# Patient Record
Sex: Female | Born: 1935 | Race: White | Hispanic: No | Smoking: Never smoker
Health system: Southern US, Community
[De-identification: ages and names within clinical notes are randomized; demographics above are authoritative.]

## PROBLEM LIST (undated history)

## (undated) DIAGNOSIS — M8580 Other specified disorders of bone density and structure, unspecified site: Secondary | ICD-10-CM

## (undated) DIAGNOSIS — IMO0002 Reserved for concepts with insufficient information to code with codable children: Secondary | ICD-10-CM

## (undated) DIAGNOSIS — C801 Malignant (primary) neoplasm, unspecified: Secondary | ICD-10-CM

## (undated) DIAGNOSIS — R339 Retention of urine, unspecified: Secondary | ICD-10-CM

## (undated) DIAGNOSIS — N819 Female genital prolapse, unspecified: Secondary | ICD-10-CM

## (undated) DIAGNOSIS — J189 Pneumonia, unspecified organism: Secondary | ICD-10-CM

## (undated) DIAGNOSIS — F0390 Unspecified dementia without behavioral disturbance: Secondary | ICD-10-CM

## (undated) HISTORY — PX: TOTAL KNEE ARTHROPLASTY: SHX125

## (undated) HISTORY — DX: Retention of urine, unspecified: R33.9

## (undated) HISTORY — DX: Reserved for concepts with insufficient information to code with codable children: IMO0002

## (undated) HISTORY — PX: HEMORRHOID SURGERY: SHX153

## (undated) HISTORY — PX: CARDIAC CATHETERIZATION: SHX172

## (undated) HISTORY — DX: Other specified disorders of bone density and structure, unspecified site: M85.80

## (undated) HISTORY — DX: Female genital prolapse, unspecified: N81.9

## (undated) HISTORY — DX: Pneumonia, unspecified organism: J18.9

## (undated) HISTORY — PX: BREAST SURGERY: SHX581

## (undated) HISTORY — PX: BACK SURGERY: SHX140

## (undated) HISTORY — DX: Malignant (primary) neoplasm, unspecified: C80.1

## (undated) HISTORY — PX: CHOLECYSTECTOMY: SHX55

## (undated) HISTORY — PX: VAGINAL HYSTERECTOMY: SUR661

---

## 1998-01-11 ENCOUNTER — Other Ambulatory Visit: Admission: RE | Admit: 1998-01-11 | Discharge: 1998-01-11 | Payer: Self-pay | Admitting: Cardiology

## 1998-01-15 ENCOUNTER — Ambulatory Visit (HOSPITAL_COMMUNITY): Admission: RE | Admit: 1998-01-15 | Discharge: 1998-01-15 | Payer: Self-pay | Admitting: Cardiology

## 1998-06-11 ENCOUNTER — Other Ambulatory Visit: Admission: RE | Admit: 1998-06-11 | Discharge: 1998-06-11 | Payer: Self-pay | Admitting: Obstetrics and Gynecology

## 1999-03-19 ENCOUNTER — Ambulatory Visit (HOSPITAL_COMMUNITY): Admission: RE | Admit: 1999-03-19 | Discharge: 1999-03-19 | Payer: Self-pay | Admitting: Gastroenterology

## 1999-06-24 ENCOUNTER — Other Ambulatory Visit: Admission: RE | Admit: 1999-06-24 | Discharge: 1999-06-24 | Payer: Self-pay | Admitting: Obstetrics and Gynecology

## 1999-10-15 ENCOUNTER — Encounter: Admission: RE | Admit: 1999-10-15 | Discharge: 1999-10-15 | Payer: Self-pay | Admitting: *Deleted

## 1999-10-16 ENCOUNTER — Ambulatory Visit (HOSPITAL_BASED_OUTPATIENT_CLINIC_OR_DEPARTMENT_OTHER): Admission: RE | Admit: 1999-10-16 | Discharge: 1999-10-16 | Payer: Self-pay | Admitting: *Deleted

## 1999-10-16 ENCOUNTER — Encounter (INDEPENDENT_AMBULATORY_CARE_PROVIDER_SITE_OTHER): Payer: Self-pay | Admitting: Specialist

## 2000-06-23 ENCOUNTER — Other Ambulatory Visit: Admission: RE | Admit: 2000-06-23 | Discharge: 2000-06-23 | Payer: Self-pay | Admitting: Obstetrics and Gynecology

## 2001-05-10 ENCOUNTER — Encounter (INDEPENDENT_AMBULATORY_CARE_PROVIDER_SITE_OTHER): Payer: Self-pay

## 2001-05-10 ENCOUNTER — Inpatient Hospital Stay (HOSPITAL_COMMUNITY): Admission: RE | Admit: 2001-05-10 | Discharge: 2001-05-11 | Payer: Self-pay | Admitting: *Deleted

## 2001-05-14 ENCOUNTER — Emergency Department (HOSPITAL_COMMUNITY): Admission: EM | Admit: 2001-05-14 | Discharge: 2001-05-14 | Payer: Self-pay | Admitting: Internal Medicine

## 2001-06-27 ENCOUNTER — Other Ambulatory Visit: Admission: RE | Admit: 2001-06-27 | Discharge: 2001-06-27 | Payer: Self-pay | Admitting: Obstetrics and Gynecology

## 2002-06-30 ENCOUNTER — Other Ambulatory Visit: Admission: RE | Admit: 2002-06-30 | Discharge: 2002-06-30 | Payer: Self-pay | Admitting: Obstetrics and Gynecology

## 2004-06-16 ENCOUNTER — Ambulatory Visit (HOSPITAL_COMMUNITY): Admission: RE | Admit: 2004-06-16 | Discharge: 2004-06-16 | Payer: Self-pay | Admitting: Gastroenterology

## 2004-07-22 ENCOUNTER — Other Ambulatory Visit: Admission: RE | Admit: 2004-07-22 | Discharge: 2004-07-22 | Payer: Self-pay | Admitting: Obstetrics and Gynecology

## 2004-09-21 DIAGNOSIS — J189 Pneumonia, unspecified organism: Secondary | ICD-10-CM

## 2004-09-21 HISTORY — DX: Pneumonia, unspecified organism: J18.9

## 2005-09-25 ENCOUNTER — Inpatient Hospital Stay (HOSPITAL_COMMUNITY): Admission: RE | Admit: 2005-09-25 | Discharge: 2005-09-30 | Payer: Self-pay | Admitting: Orthopedic Surgery

## 2005-09-25 ENCOUNTER — Ambulatory Visit: Payer: Self-pay | Admitting: Physical Medicine & Rehabilitation

## 2006-07-28 ENCOUNTER — Other Ambulatory Visit: Admission: RE | Admit: 2006-07-28 | Discharge: 2006-07-28 | Payer: Self-pay | Admitting: Obstetrics and Gynecology

## 2007-10-24 ENCOUNTER — Encounter: Admission: RE | Admit: 2007-10-24 | Discharge: 2007-10-24 | Payer: Self-pay | Admitting: General Surgery

## 2007-10-27 ENCOUNTER — Ambulatory Visit (HOSPITAL_BASED_OUTPATIENT_CLINIC_OR_DEPARTMENT_OTHER): Admission: RE | Admit: 2007-10-27 | Discharge: 2007-10-28 | Payer: Self-pay | Admitting: General Surgery

## 2008-02-22 ENCOUNTER — Encounter: Admission: RE | Admit: 2008-02-22 | Discharge: 2008-02-22 | Payer: Self-pay | Admitting: Orthopedic Surgery

## 2008-09-10 ENCOUNTER — Ambulatory Visit: Payer: Self-pay | Admitting: Obstetrics and Gynecology

## 2009-05-28 ENCOUNTER — Inpatient Hospital Stay (HOSPITAL_COMMUNITY): Admission: AD | Admit: 2009-05-28 | Discharge: 2009-06-04 | Payer: Self-pay | Admitting: Gastroenterology

## 2009-06-03 ENCOUNTER — Encounter (INDEPENDENT_AMBULATORY_CARE_PROVIDER_SITE_OTHER): Payer: Self-pay | Admitting: Gastroenterology

## 2009-09-11 ENCOUNTER — Other Ambulatory Visit: Admission: RE | Admit: 2009-09-11 | Discharge: 2009-09-11 | Payer: Self-pay | Admitting: Obstetrics and Gynecology

## 2009-09-11 ENCOUNTER — Ambulatory Visit: Payer: Self-pay | Admitting: Obstetrics and Gynecology

## 2009-10-08 ENCOUNTER — Ambulatory Visit: Payer: Self-pay | Admitting: Obstetrics and Gynecology

## 2009-12-12 ENCOUNTER — Ambulatory Visit: Payer: Self-pay | Admitting: Obstetrics and Gynecology

## 2009-12-26 ENCOUNTER — Ambulatory Visit: Payer: Self-pay | Admitting: Obstetrics and Gynecology

## 2009-12-30 ENCOUNTER — Ambulatory Visit: Payer: Self-pay | Admitting: Obstetrics and Gynecology

## 2010-01-07 ENCOUNTER — Ambulatory Visit: Payer: Self-pay | Admitting: Obstetrics and Gynecology

## 2010-01-23 ENCOUNTER — Ambulatory Visit: Payer: Self-pay | Admitting: Obstetrics and Gynecology

## 2010-05-27 ENCOUNTER — Ambulatory Visit: Payer: Self-pay | Admitting: Obstetrics and Gynecology

## 2010-08-22 ENCOUNTER — Inpatient Hospital Stay (HOSPITAL_COMMUNITY)
Admission: RE | Admit: 2010-08-22 | Discharge: 2010-08-26 | Payer: Self-pay | Source: Home / Self Care | Admitting: Orthopedic Surgery

## 2010-08-25 ENCOUNTER — Encounter (INDEPENDENT_AMBULATORY_CARE_PROVIDER_SITE_OTHER): Payer: Self-pay | Admitting: Orthopedic Surgery

## 2010-10-28 ENCOUNTER — Other Ambulatory Visit: Payer: Self-pay | Admitting: Orthopedic Surgery

## 2010-10-28 ENCOUNTER — Ambulatory Visit
Admission: RE | Admit: 2010-10-28 | Discharge: 2010-10-28 | Disposition: A | Discharge status: Home / Self Care | Payer: Medicare Other | Source: Ambulatory Visit | Attending: Orthopedic Surgery | Admitting: Orthopedic Surgery

## 2010-10-28 DIAGNOSIS — R52 Pain, unspecified: Secondary | ICD-10-CM

## 2010-10-28 DIAGNOSIS — R609 Edema, unspecified: Secondary | ICD-10-CM

## 2010-12-01 ENCOUNTER — Encounter (INDEPENDENT_AMBULATORY_CARE_PROVIDER_SITE_OTHER): Payer: Medicare Other | Admitting: Obstetrics and Gynecology

## 2010-12-01 DIAGNOSIS — N8111 Cystocele, midline: Secondary | ICD-10-CM

## 2010-12-01 DIAGNOSIS — R82998 Other abnormal findings in urine: Secondary | ICD-10-CM

## 2010-12-01 DIAGNOSIS — N760 Acute vaginitis: Secondary | ICD-10-CM

## 2010-12-02 LAB — CBC
HCT: 26.4 % — ABNORMAL LOW (ref 36.0–46.0)
HCT: 28.1 % — ABNORMAL LOW (ref 36.0–46.0)
HCT: 28.6 % — ABNORMAL LOW (ref 36.0–46.0)
Hemoglobin: 9.3 g/dL — ABNORMAL LOW (ref 12.0–15.0)
Hemoglobin: 9.5 g/dL — ABNORMAL LOW (ref 12.0–15.0)
Hemoglobin: 9.8 g/dL — ABNORMAL LOW (ref 12.0–15.0)
MCH: 30.3 pg (ref 26.0–34.0)
MCH: 30.6 pg (ref 26.0–34.0)
MCH: 30.9 pg (ref 26.0–34.0)
MCH: 31.4 pg (ref 26.0–34.0)
MCHC: 35.2 g/dL (ref 30.0–36.0)
MCV: 89.4 fL (ref 78.0–100.0)
MCV: 90.1 fL (ref 78.0–100.0)
Platelets: 252 10*3/uL (ref 150–400)
RBC: 3.12 MIL/uL — ABNORMAL LOW (ref 3.87–5.11)
RBC: 3.14 MIL/uL — ABNORMAL LOW (ref 3.87–5.11)
RDW: 13.2 % (ref 11.5–15.5)
RDW: 13.6 % (ref 11.5–15.5)
WBC: 10.6 10*3/uL — ABNORMAL HIGH (ref 4.0–10.5)
WBC: 9.9 10*3/uL (ref 4.0–10.5)

## 2010-12-02 LAB — BASIC METABOLIC PANEL
BUN: 7 mg/dL (ref 6–23)
BUN: 9 mg/dL (ref 6–23)
CO2: 23 mEq/L (ref 19–32)
CO2: 23 mEq/L (ref 19–32)
CO2: 23 mEq/L (ref 19–32)
CO2: 23 mEq/L (ref 19–32)
Calcium: 7.8 mg/dL — ABNORMAL LOW (ref 8.4–10.5)
Calcium: 8.1 mg/dL — ABNORMAL LOW (ref 8.4–10.5)
Chloride: 100 mEq/L (ref 96–112)
Chloride: 96 mEq/L (ref 96–112)
Creatinine, Ser: 0.51 mg/dL (ref 0.4–1.2)
GFR calc Af Amer: >60 mL/min (ref >60)
GFR calc Af Amer: >60 mL/min (ref >60)
GFR calc non Af Amer: >60 mL/min (ref >60)
Glucose, Bld: 125 mg/dL — ABNORMAL HIGH (ref 70–99)
Potassium: 3.3 mEq/L — ABNORMAL LOW (ref 3.5–5.1)
Potassium: 3.7 mEq/L (ref 3.5–5.1)
Potassium: 3.8 mEq/L (ref 3.5–5.1)
Sodium: 124 mEq/L — ABNORMAL LOW (ref 135–145)
Sodium: 130 mEq/L — ABNORMAL LOW (ref 135–145)
Sodium: 132 mEq/L — ABNORMAL LOW (ref 135–145)

## 2010-12-02 LAB — URINALYSIS, MICROSCOPIC ONLY
Glucose, UA: NEGATIVE mg/dL
Hgb urine dipstick: NEGATIVE
Ketones, ur: NEGATIVE mg/dL
Protein, ur: NEGATIVE mg/dL
Urobilinogen, UA: 1 mg/dL (ref 0.0–1.0)

## 2010-12-02 LAB — URINE CULTURE
Colony Count: NO GROWTH
Colony Count: NO GROWTH
Culture  Setup Time: 201112061209
Culture: NO GROWTH
Special Requests: NEGATIVE

## 2010-12-03 LAB — CBC
MCHC: 34.8 g/dL (ref 30.0–36.0)
Platelets: 370 10*3/uL (ref 150–400)
RDW: 13.4 % (ref 11.5–15.5)
WBC: 10.8 10*3/uL — ABNORMAL HIGH (ref 4.0–10.5)

## 2010-12-03 LAB — SURGICAL PCR SCREEN
MRSA, PCR: POSITIVE — AB
Staphylococcus aureus: POSITIVE — AB

## 2010-12-03 LAB — APTT: aPTT: 27 seconds (ref 24–37)

## 2010-12-03 LAB — COMPREHENSIVE METABOLIC PANEL
ALT: 19 U/L (ref 0–35)
Albumin: 3.5 g/dL (ref 3.5–5.2)
Calcium: 10.2 mg/dL (ref 8.4–10.5)
GFR calc Af Amer: >60 mL/min (ref >60)
Glucose, Bld: 99 mg/dL (ref 70–99)
Potassium: 4.2 mEq/L (ref 3.5–5.1)
Sodium: 137 mEq/L (ref 135–145)
Total Protein: 7.3 g/dL (ref 6.0–8.3)

## 2010-12-03 LAB — PROTIME-INR: INR: 0.97 (ref 0.00–1.49)

## 2010-12-03 LAB — URINE CULTURE
Culture  Setup Time: 201111290945
Culture: NO GROWTH

## 2010-12-03 LAB — URINE MICROSCOPIC-ADD ON

## 2010-12-03 LAB — URINALYSIS, ROUTINE W REFLEX MICROSCOPIC
Bilirubin Urine: NEGATIVE
Nitrite: NEGATIVE
Specific Gravity, Urine: 1.015 (ref 1.005–1.030)
Urobilinogen, UA: 0.2 mg/dL (ref 0.0–1.0)
pH: 6 (ref 5.0–8.0)

## 2010-12-03 LAB — DIFFERENTIAL
Eosinophils Absolute: 0.3 10*3/uL (ref 0.0–0.7)
Lymphs Abs: 2.5 10*3/uL (ref 0.7–4.0)
Monocytes Absolute: 0.9 10*3/uL (ref 0.1–1.0)
Monocytes Relative: 8 % (ref 3–12)
Neutrophils Relative %: 65 % (ref 43–77)

## 2010-12-03 LAB — TYPE AND SCREEN: ABO/RH(D): O NEG

## 2010-12-26 LAB — BASIC METABOLIC PANEL
BUN: 14 mg/dL (ref 6–23)
BUN: 5 mg/dL — ABNORMAL LOW (ref 6–23)
BUN: 6 mg/dL (ref 6–23)
BUN: 6 mg/dL (ref 6–23)
BUN: 9 mg/dL (ref 6–23)
CO2: 22 mEq/L (ref 19–32)
CO2: 25 mEq/L (ref 19–32)
CO2: 28 mEq/L (ref 19–32)
Calcium: 8.3 mg/dL — ABNORMAL LOW (ref 8.4–10.5)
Calcium: 8.4 mg/dL (ref 8.4–10.5)
Calcium: 8.7 mg/dL (ref 8.4–10.5)
Chloride: 102 mEq/L (ref 96–112)
Chloride: 104 mEq/L (ref 96–112)
Chloride: 94 mEq/L — ABNORMAL LOW (ref 96–112)
Chloride: 94 mEq/L — ABNORMAL LOW (ref 96–112)
Creatinine, Ser: 0.6 mg/dL (ref 0.4–1.2)
Creatinine, Ser: 0.62 mg/dL (ref 0.4–1.2)
Creatinine, Ser: 0.64 mg/dL (ref 0.4–1.2)
Creatinine, Ser: 0.67 mg/dL (ref 0.4–1.2)
Creatinine, Ser: 0.68 mg/dL (ref 0.4–1.2)
Creatinine, Ser: 0.73 mg/dL (ref 0.4–1.2)
GFR calc Af Amer: >60 mL/min (ref >60)
GFR calc Af Amer: >60 mL/min (ref >60)
GFR calc Af Amer: >60 mL/min (ref >60)
GFR calc non Af Amer: >60 mL/min (ref >60)
GFR calc non Af Amer: >60 mL/min (ref >60)
GFR calc non Af Amer: >60 mL/min (ref >60)
GFR calc non Af Amer: >60 mL/min (ref >60)
Glucose, Bld: 107 mg/dL — ABNORMAL HIGH (ref 70–99)
Glucose, Bld: 110 mg/dL — ABNORMAL HIGH (ref 70–99)
Glucose, Bld: 96 mg/dL (ref 70–99)
Potassium: 2.8 mEq/L — ABNORMAL LOW (ref 3.5–5.1)
Potassium: 2.9 mEq/L — ABNORMAL LOW (ref 3.5–5.1)
Potassium: 3.7 mEq/L (ref 3.5–5.1)
Potassium: 3.9 mEq/L (ref 3.5–5.1)
Sodium: 132 mEq/L — ABNORMAL LOW (ref 135–145)

## 2010-12-26 LAB — COMPREHENSIVE METABOLIC PANEL
ALT: 16 U/L (ref 0–35)
Alkaline Phosphatase: 113 U/L (ref 39–117)
BUN: 7 mg/dL (ref 6–23)
CO2: 25 mEq/L (ref 19–32)
Chloride: 100 mEq/L (ref 96–112)
GFR calc non Af Amer: >60 mL/min (ref >60)
Glucose, Bld: 109 mg/dL — ABNORMAL HIGH (ref 70–99)
Potassium: 3.7 mEq/L (ref 3.5–5.1)
Total Bilirubin: 0.7 mg/dL (ref 0.3–1.2)
Total Protein: 5.9 g/dL — ABNORMAL LOW (ref 6.0–8.3)

## 2010-12-26 LAB — CBC
HCT: 29.1 % — ABNORMAL LOW (ref 36.0–46.0)
HCT: 29.8 % — ABNORMAL LOW (ref 36.0–46.0)
HCT: 30.1 % — ABNORMAL LOW (ref 36.0–46.0)
HCT: 30.5 % — ABNORMAL LOW (ref 36.0–46.0)
HCT: 30.9 % — ABNORMAL LOW (ref 36.0–46.0)
HCT: 32.3 % — ABNORMAL LOW (ref 36.0–46.0)
Hemoglobin: 10.1 g/dL — ABNORMAL LOW (ref 12.0–15.0)
Hemoglobin: 10.5 g/dL — ABNORMAL LOW (ref 12.0–15.0)
Hemoglobin: 10.6 g/dL — ABNORMAL LOW (ref 12.0–15.0)
Hemoglobin: 11.1 g/dL — ABNORMAL LOW (ref 12.0–15.0)
MCHC: 34.3 g/dL (ref 30.0–36.0)
MCHC: 34.3 g/dL (ref 30.0–36.0)
MCHC: 34.5 g/dL (ref 30.0–36.0)
MCHC: 34.6 g/dL (ref 30.0–36.0)
MCV: 93.5 fL (ref 78.0–100.0)
MCV: 94.1 fL (ref 78.0–100.0)
MCV: 94.4 fL (ref 78.0–100.0)
MCV: 94.6 fL (ref 78.0–100.0)
Platelets: 242 10*3/uL (ref 150–400)
Platelets: 301 10*3/uL (ref 150–400)
Platelets: 377 10*3/uL (ref 150–400)
Platelets: 385 10*3/uL (ref 150–400)
Platelets: 582 10*3/uL — ABNORMAL HIGH (ref 150–400)
RBC: 3.02 MIL/uL — ABNORMAL LOW (ref 3.87–5.11)
RBC: 3.07 MIL/uL — ABNORMAL LOW (ref 3.87–5.11)
RBC: 3.26 MIL/uL — ABNORMAL LOW (ref 3.87–5.11)
RBC: 3.27 MIL/uL — ABNORMAL LOW (ref 3.87–5.11)
RBC: 3.41 MIL/uL — ABNORMAL LOW (ref 3.87–5.11)
RDW: 13.6 % (ref 11.5–15.5)
RDW: 13.6 % (ref 11.5–15.5)
RDW: 13.7 % (ref 11.5–15.5)
RDW: 13.9 % (ref 11.5–15.5)
RDW: 14.4 % (ref 11.5–15.5)
WBC: 11.8 10*3/uL — ABNORMAL HIGH (ref 4.0–10.5)
WBC: 15.2 10*3/uL — ABNORMAL HIGH (ref 4.0–10.5)
WBC: 16.4 10*3/uL — ABNORMAL HIGH (ref 4.0–10.5)
WBC: 18.3 10*3/uL — ABNORMAL HIGH (ref 4.0–10.5)

## 2010-12-26 LAB — URINALYSIS, ROUTINE W REFLEX MICROSCOPIC
Bilirubin Urine: NEGATIVE
Bilirubin Urine: NEGATIVE
Glucose, UA: NEGATIVE mg/dL
Glucose, UA: NEGATIVE mg/dL
Hgb urine dipstick: NEGATIVE
Ketones, ur: 15 mg/dL — AB
Ketones, ur: NEGATIVE mg/dL
Nitrite: NEGATIVE
Nitrite: POSITIVE — AB
Protein, ur: NEGATIVE mg/dL
Protein, ur: NEGATIVE mg/dL
Specific Gravity, Urine: 1.017 (ref 1.005–1.030)
Urobilinogen, UA: 1 mg/dL (ref 0.0–1.0)
pH: 6 (ref 5.0–8.0)

## 2010-12-26 LAB — TSH: TSH: 2.87 u[IU]/mL (ref 0.350–4.500)

## 2010-12-26 LAB — HEPATIC FUNCTION PANEL
ALT: 26 U/L (ref 0–35)
AST: 17 U/L (ref 0–37)
AST: 20 U/L (ref 0–37)
Albumin: 2 g/dL — ABNORMAL LOW (ref 3.5–5.2)
Albumin: 2.3 g/dL — ABNORMAL LOW (ref 3.5–5.2)
Alkaline Phosphatase: 120 U/L — ABNORMAL HIGH (ref 39–117)
Bilirubin, Direct: 0.7 mg/dL — ABNORMAL HIGH (ref 0.0–0.3)
Indirect Bilirubin: 1 mg/dL — ABNORMAL HIGH (ref 0.3–0.9)
Total Bilirubin: 1.7 mg/dL — ABNORMAL HIGH (ref 0.3–1.2)
Total Protein: 5.3 g/dL — ABNORMAL LOW (ref 6.0–8.3)
Total Protein: 6 g/dL (ref 6.0–8.3)

## 2010-12-26 LAB — CULTURE, BLOOD (ROUTINE X 2): Culture: NO GROWTH

## 2010-12-26 LAB — URINE CULTURE: Culture: NO GROWTH

## 2010-12-26 LAB — URINE MICROSCOPIC-ADD ON

## 2010-12-26 LAB — DIFFERENTIAL
Basophils Absolute: 0 10*3/uL (ref 0.0–0.1)
Basophils Absolute: 0 10*3/uL (ref 0.0–0.1)
Basophils Absolute: 0 10*3/uL (ref 0.0–0.1)
Basophils Relative: 0 % (ref 0–1)
Basophils Relative: 0 % (ref 0–1)
Basophils Relative: 0 % (ref 0–1)
Eosinophils Absolute: 0 10*3/uL (ref 0.0–0.7)
Eosinophils Absolute: 0.1 10*3/uL (ref 0.0–0.7)
Neutro Abs: 13.3 10*3/uL — ABNORMAL HIGH (ref 1.7–7.7)
Neutro Abs: 14 10*3/uL — ABNORMAL HIGH (ref 1.7–7.7)
Neutro Abs: 8.5 10*3/uL — ABNORMAL HIGH (ref 1.7–7.7)
Neutrophils Relative %: 78 % — ABNORMAL HIGH (ref 43–77)
Neutrophils Relative %: 84 % — ABNORMAL HIGH (ref 43–77)
Neutrophils Relative %: 86 % — ABNORMAL HIGH (ref 43–77)

## 2010-12-26 LAB — MAGNESIUM: Magnesium: 1.8 mg/dL (ref 1.5–2.5)

## 2010-12-26 LAB — VITAMIN B12: Vitamin B-12: 1250 pg/mL — ABNORMAL HIGH (ref 211–911)

## 2010-12-26 LAB — CANCER ANTIGEN 19-9: CA 19-9: 181.5 U/mL — ABNORMAL HIGH (ref <35.0)

## 2010-12-26 LAB — BRAIN NATRIURETIC PEPTIDE: Pro B Natriuretic peptide (BNP): 296 pg/mL — ABNORMAL HIGH (ref 0.0–100.0)

## 2010-12-26 LAB — FOLATE: Folate: >20 ng/mL

## 2011-02-03 NOTE — Op Note (Signed)
NAMEMERRIE, Anna Morris               ACCOUNT NO.:  [id number]   MEDICAL RECORD NO.:  [id number]          PATIENT TYPE:  AMB   LOCATION:  DSC                          FACILITY:  [facility]   PHYSICIAN:  Cherylynn Ridges, M.D.    DATE OF BIRTH:  09/02/1917   DATE OF PROCEDURE:  10/27/2007  DATE OF DISCHARGE:                               OPERATIVE REPORT   PREOPERATIVE DIAGNOSIS:  Right inguinal hernia.   POSTOPERATIVE DIAGNOSIS:  Right direct inguinal hernia.   PROCEDURE:  Modified Cooper/McVay right inguinal hernia repair with  mesh.   SURGEON:  Cherylynn Ridges, M.D.   ANESTHESIA:  General with a laryngeal airway.   ESTIMATED BLOOD LOSS:  Less than 30 mL.   No complications.   CONDITION:  Stable.   FINDINGS:  The patient had a very small direct defect with protrusion of  preperitoneal tissue.   INDICATIONS FOR OPERATION:  The patient is an 77 year old with a  symptomatic right inguinal hernia, who comes in now for repair.   OPERATION:  The patient was taken to the operating room and placed on  the table in supine position.  After an adequate general laryngeal  airway anesthetic was administered, she was prepped and draped in the  usual sterile manner exposing the right inguinal area.   A transverse curvilinear incision about 6 cm long was made using a #10  blade and taken down into the subcutaneous tissue, where a subcutaneous  vein was ligated with 3-0 Vicryl ties.  We dissected down to the  external oblique fascia, which we then opened along with its fibers  through the superficial ring.  The ilioinguinal nerve was identified  along with the round ligament.  We were able to mobilize that laterally  as we identified the hernia defect medially.  This direct defect was  about 2 cm in size and was imbricated on itself using interrupted 0  Ethibond sutures.  We also suture-ligated a piece of fat at the base of  the round ligament.  The nerve was spared.  We then placed an oval piece  of mesh measuring 5 x 3 cm in size, attaching it to the pubic tubercle  and the conjoined tendon anteromedially, and inferolaterally we started  off attaching it to Cooper's ligament and then as we got to the  transition zone attached it to the reflected portion of the inguinal  ligament, making sure not to place any pressure on the femoral vein or  the femoral artery.   The mesh was attached using a running 0 Prolene suture.  Once this was  done we irrigated it with antibiotic solution, in which it had been  soaked prior to implantation.  Once this was done we allowed the round  ligament and the ilioinguinal nerve fall back into the canal.  The  external oblique fascia was reapproximated using running 3-0 Vicryl  suture.  The subcu, Scarpa fascia was reapproximated using interrupted 3-  0 Vicryl.  Then the skin was closed using  a running subcuticular stitch of 4-0 Monocryl.  Marcaine 0.5% with  epinephrine was injected  into the wound up to 20 mL.  Dermabond, Steri-  Strips and Tegaderm were applied.  All needle, sponge counts and  instrument counts were correct.      Cherylynn Ridges, M.D.  Electronically Signed     JOW/MEDQ  D:  10/27/2007  T:  10/28/2007  Job:  [id number]

## 2011-02-06 NOTE — Op Note (Signed)
[facility]  Patient:    Anna Morris                       MRN: [id number] Proc. Date: 10/16/99 Adm. Date:  [id number] Attending:  Stephenie Acres                           Operative Report  PREOPERATIVE DIAGNOSIS:  Left shoulder and left forearm mass.  POSTOPERATIVE DIAGNOSIS:  Left shoulder and left forearm mass.  PROCEDURE:  Excision of both masses.  ANESTHESIA:  Local MAC.  SURGEON:  Catalina Lunger, M.D.  DESCRIPTION OF PROCEDURE:  The patient was taken to the operating room and placed in a supine position and after adequate anesthesia was induced, using MAC technique, the left shoulder and arm were prepped and draped in the normal sterile fashion using 1% lidocaine local anesthesia overlying the mass in the left lateral shoulder, the skin and subcutaneous tissue was anesthetized.  Incision was made, dissected down, a fatty tumor was delivered up and through the wound without difficulty, excised in its entirety.  Adequate hemostasis was ensured and the skin was closed with subcuticular 4-0 Monocryl.  Then, I turned my attention to the small area in the left antecubital fossa.  Again, this was anesthetized.  A small incision was made over it.  A small fatty tumor was delivered and excised.  Again the skin was closed with subcuticular 4-0 Monocryl.  Steri-Strips and sterile dressings were applied. The patient tolerated the procedure well and went to the PACU in good condition. DD:  10/16/99 TD:  10/16/99 Job: [id number]

## 2011-02-06 NOTE — Op Note (Signed)
NAME:  Anna Morris, Anna Morris               ACCOUNT NO.:  [id number]   MEDICAL RECORD NO.:  [id number]          PATIENT TYPE:  AMB   LOCATION:  ENDO                         FACILITY:  [facility]   PHYSICIAN:  Anselmo Rod, M.D.  DATE OF BIRTH:  07-15-1917   DATE OF PROCEDURE:  06/16/2004  DATE OF DISCHARGE:                                 OPERATIVE REPORT   PROCEDURE PERFORMED:  Screening colonoscopy.   ENDOSCOPIST:  Charna Elizabeth, M.D.   INSTRUMENT USED:  Olympus video colonoscope.   INDICATIONS FOR PROCEDURE:  The patient is an 77 year old white female with  a history of pandiverticulosis and a personal history of breast cancer with  recent rectal bleeding.  Rule out colonic polyps, masses, etc.   PREPROCEDURE PREPARATION:  Informed consent was procured from the patient.  The patient was fasted for eight hours prior to the procedure and prepped  with a bottle of magnesium citrate and a gallon of GoLYTELY the night prior  to the procedure.   PREPROCEDURE PHYSICAL:  The patient had stable vital signs.  Neck supple.  Chest clear to auscultation.  S1 and S2 regular.  Abdomen soft with normal  bowel sounds.   DESCRIPTION OF PROCEDURE:  The patient was placed in left lateral decubitus  position and sedated with 50 mg of Demerol and 5 mg of Versed in slow  incremental doses.  Once the patient was adequately sedated and maintained  on low flow oxygen and continuous cardiac monitoring, the Olympus video  colonoscope was advanced from the rectum to the cecum.  There was evidence  of pandiverticulosis.  Small internal hemorrhoids and external hemorrhoids  were seen on retroflexion and anal inspection.  No masses or polyps were  identified.  The appendicular orifice and ileocecal valve were clearly  visualized and photographed.  The patient tolerated the procedure well  without immediate complications.   IMPRESSION:  1.  Pandiverticulosis.  2.  Small internal and external hemorrhoids.  3.  No  masses or polyps seen.   RECOMMENDATIONS:  1.  Continue high fiber diet with liberal fluid intake.  2.  Repeat colonoscopy is recommended in the next five years unless the      patient develops any abnormal symptoms in the interim.  3.  Outpatient followup as need arises in the future.       JNM/MEDQ  D:  06/16/2004  T:  06/17/2004  Job:  [id number]   cc:   Windle Guard, M.D.  [address]  [place], [place] [address]  Fax: [phone]

## 2011-02-06 NOTE — Op Note (Signed)
NAME:  Anna Morris, Anna Morris NO.:  [id number]   MEDICAL RECORD NO.:  [id number]          PATIENT TYPE:  INP   LOCATION:  2899                         FACILITY:  [facility]   PHYSICIAN:  Dyke Brackett, M.D.    DATE OF BIRTH:  1916-12-09   DATE OF PROCEDURE:  DATE OF DISCHARGE:                                 OPERATIVE REPORT   INDICATIONS:  The patient is an 77 year old with intractable knee pain on  the right side thought to be amenable to inpatient hospitalization for total  knee replacement.   PREOPERATIVE DIAGNOSIS:  Severe osteoarthritis of right knee.   POSTOPERATIVE DIAGNOSIS:  Severe osteoarthritis of right knee.   OPERATION:  Right total knee replacement (DePuy LCS rotating platform medium  with size-2 tibia, medium patella, and 15-mm bearing).   SURGEON:  Dyke Brackett, M.D.   ASSISTANTAlisa Graff, P.A.   TOURNIQUET TIME:  One hour 13 minutes.   ANESTHESIA:  General.   DESCRIPTION OF PROCEDURE:  Sterile prep and drape with exsanguination of the  leg and inflation of tourniquet to 350.  Straight skin incision, medial  parapatellar approach to the knee.  Cutting the tibia about 2 to 3 mm below  the most diseased medial compartment with a moderate varus inclination to  the knee with more severe changes on the medial side.  Femur was sized and  cut with an anterior-posterior femoral cut, had a 15 mm flexion/extension  gap equal with the 4-degree valgus cut.  Moderate balancing required with  stripping of the medial side.  A 15 mm again flexion and extension gaps  equalized and balanced.   Chamfer cuts were placed with the finishing guide as well as the keel holes  for the prosthesis on the femoral side.  The tibia was cut with a keel for  the tibia as well with a size 2.  Trials were placed.  Slight recurvatum was  noted with good stability though in flexion at 15, and the patella was cut  leaving about 14 mm ___________ three-peg patella.  Patella trial was  used  with the final components and excellent stability, no tendency to varus  ___________.  Full range of motion.  Trials removed.  The bony surfaces were  irrigated.  The final prosthesis was inserted, tibia followed by femur and  patella.  Excess cement was removed, allowed to harden.  Copious irrigation  carried out prior  to and before insertion of the trial components.  Closure was effected on  the capsule with #1 Ethibond, 2-0 Vicryl and skin clips.  Marcaine with  epinephrine of the skin.  Lightly compressive sterile dressing and knee  immobilizer applied.  Taken to the recovery room in stable condition.      Dyke Brackett, M.D.  Electronically Signed     WDC/MEDQ  D:  09/25/2005  T:  09/25/2005  Job:  [id number]

## 2011-02-06 NOTE — H&P (Signed)
NAME:  Anna Morris, Anna Morris NO.:  [id number]   MEDICAL RECORD NO.:  [id number]          PATIENT TYPE:  INP   LOCATION:  NA                           FACILITY:  [facility]   PHYSICIAN:  Dyke Brackett, M.D.    DATE OF BIRTH:  23-Jun-1917   DATE OF ADMISSION:  09/25/2005  DATE OF DISCHARGE:                                HISTORY & PHYSICAL   CHIEF COMPLAINT:  Right knee pain for the last 3-4 years.   HISTORY OF PRESENT ILLNESS:  This 77 year old white female patient presented  to Dr. Madelon Lips with a three to four-year history of gradual onset, but  progressively worsening right knee pain.  She has had no known injury or  prior surgery to her knee but the pain is getting worse.  It is now a  constant sensation which the intensity varies.  It is a throbbing sensation  over the medial joint line with some radiation proximally into the mid thigh  at times.  Pain increases if she turns the wrong way or catches her right  foot on the floor and then decreases with sitting down or Tylenol Arthritis.  She is not taking any additional medications for pain.  She does complain of  the knee catching at times, locking, swelling, and keeping her up at night  at times.  No grinding or giving way.  She is not ambulating with any  assistive devices.  She had received cortisone shots in the past and that  helped for about a year.   ALLERGIES:  AUGMENTIN, LEVAQUIN, CIPRO, SULFA, DARVOCET caused chest  discomfort, CODEINE caused unknown reaction, FLAGYL, ERYTHROMYCIN.   CURRENT MEDICATIONS:  1.  Atenolol 50 mg one tablet p.o. b.i.d.  2.  Evista 60 mg one tablet p.o. q.a.m.  3.  Aspirin 81 mg one tablet p.o. q.a.m.  Last dose September 17, 2005.  4.  Patanol ophthalmic solution 0.1% one drop in both eyes b.i.d.  5.  Refresh tears one drop both eyes b.i.d.  6.  Metamucil one tablespoon p.o. q.h.s.  7.  Lasix 40 mg one tablet p.o. q.a.m. p.r.n. swelling.  8.  Multivitamin one tablet p.o. q.a.m.  9.  Vitamin C and calcium tablets one tablet of each p.o. q.a.m.   PAST MEDICAL HISTORY:  1.  Hypertension diagnosed two to three years ago.  2.  Diverticulosis.  3.  Osteoporosis.   She denies any history of diabetes mellitus, thyroid disease, hiatal hernia,  reflux, peptic ulcer disease, heart disease, asthma, or any other chronic  medical conditions other than noted previously.   PAST SURGICAL HISTORY:  1.  Appendectomy, age 56.  2.  Tonsillectomy.  3.  Hysterectomy, 1969.  4.  Right ear surgery due to mastoid problems, 1970 by Dr. Anner Crete.  5.  Mastectomy 1975 by Dr. Glynda Jaeger.  6.  Removal of ruptured lumbar disk, 1996 by Dr. Venetia Maxon.  7.  Hemorrhoidectomy and hernia repair, 1997.  8.  Laparoscopic cholecystectomy by Dr. Luan Pulling, 2002.   She denies any complications from the above-mentioned procedures.   SOCIAL HISTORY:  She denies any history of  cigarette smoking, alcohol use,  or drug use.  She is a widow and has three children.  She lives by herself  in a one-story house with one step into the main entrance.  She is a retired  Architectural technologist.  Her medical doctor is Dr. Windle Guard in [place], [place] at [phone].   FAMILY HISTORY:  Mother died at the age of 32 with TIAs, stroke, and  hypertension.  Father died at the age of 24 with an aneurysm.  She has one  living sister age 77 who is healthy and she had two brothers and two sisters  who have passed away, one brother in his 38s with complications from  alcoholism, one in his 72s, a sister in her 25s, and one sister in her 64s  who died with breast cancer.  She has three sons and they are age 32, 67,  and 21 and all are alive and well.   REVIEW OF SYSTEMS:  She does have recent bladder infections, last one in  November 2006 and Dr. Vonita Moss treats her for that.  She is off antibiotics  now.  She does have glaucoma.  She gets recurrent right ear infections as a  complication from her surgery way back  in the 1970s.  She has some shortness  of breath with exertion.  Occasional diarrhea due to the diverticulosis.  Has some arthritic complaints of her hands.  She does wear glasses and  hearing aid on the right.  She does have some urinary urgency and frequency  at times.  She does have a living will.  All other systems are negative and  noncontributory.   PHYSICAL EXAMINATION:  GENERAL:  Well-developed, well-nourished elderly  white female who walks with a mildly antalgic gait.  No assistive devices.  Accompanied by her son.  Mood and affect are appropriate.  Talks easily with  examiner.  Height 5 feet 1 inch.  Weight 141 pounds, BMI is 26.  VITAL SIGNS:  Temperature 97.8 degrees Fahrenheit, pulse 56, respirations  16, and blood pressure 152/80.  HEENT:  Normocephalic, atraumatic without frontal or maxillary sinus  tenderness to palpation.  Conjunctivae pink.  Sclerae anicteric.  PERLA.  EOMs intact.  No visible external ear deformities.  Hearing grossly intact.  Left tympanic membrane is pearly gray with good light reflex.  Right TM is  scarred.  Light reflex is present but there is some scarring noted in the  ear canal.  Nose and nasal septum midline.  Nasal mucosa pink and moist  without exudates or polyps noted.  Buccal mucosa pink and moist.  Dentition  in good repair.  Pharynx without erythema or exudates.  Tongue and uvula  midline.  Tongue without vesiculations and uvula rises equally with  phonation.  NECK:  No visible masses or lesions noted.  Trachea midline.  No palpable  lymphadenopathy nor thyromegaly.  Carotids +2 bilaterally without bruits.  She does have decrease in range of motion of her cervical spine but it seems  to be fairly even.  It seems to be kind of even throughout all the ranges.  Nontender to palpation with range of motion of the cervical spine.  CARDIOVASCULAR:  Heart rate and rhythm regular.  S1-S2 present without rubs,  clicks, or murmurs  noted. RESPIRATORY:  Respirations even and unlabored.  Breath sounds clear to  auscultation bilaterally without rales or wheezes noted.  ABDOMEN:  Rounded abdominal contour.  Bowel sounds present x4 quadrants.  Soft, nontender  to palpation without hepatosplenomegaly nor CVA tenderness.  Femoral pulses +2 bilaterally.  Nontender to palpation along the vertebral  column.  BREASTS:  Deferred at this time.  GENITOURINARY:  Deferred at this time.  RECTAL:  Deferred at this time.  PELVIC:  Deferred at this time.  MUSCULOSKELETAL:  No obvious deformities bilateral upper extremities with  full range of motion of these extremities without pain.  Radial pulses +2  bilaterally.  She has full range of motion of her hips, ankles, and toes  bilaterally.  DP and PT pulses are +2 but there is mild pitting +1-2 edema  both lower extremities.  No calf pain with palpation.  Negative Homans sign  bilaterally.   Left knee skin intact.  No erythema or ecchymosis.  She has full extension  and flexion to 130 degrees without crepitus.  She does have pain with  palpation over the lateral joint line.  No effusion.  Stable to varus and  valgus stress.  Negative anterior drawer.  Right knee skin intact.  No  erythema or ecchymosis.  Full extension and flexion only to 110 degrees with  minimal crepitus.  There is maybe a small effusion in the knee.  She does  have some pain with palpation over the medial joint line, none laterally.  Stable to varus and valgus stress.  Negative anterior drawer.  NEUROLOGIC:  Alert and oriented x3.  Cranial nerves II-XII are grossly  intact.  Strength 5/5 bilateral upper and lower extremities.  Rapid  alternating movements intact.  Deep tendon reflexes 2+ bilateral upper and  lower extremities.  Sensation intact to light touch.   RADIOLOGIC FINDINGS:  X-rays taken of her right knee in September of 2004  showed moderate changes medial compartment arthrosis.   IMPRESSION:  1.   Osteoarthritis medial compartment right knee.  2.  Hypertension.  3.  Diverticulosis.  4.  Osteoporosis.  5.  Glaucoma.  6.  History of frequent bladder infections treated by Dr. Vonita Moss.  7.  History of right ear mastoid problems treated with surgery.   PLAN:  Ms. Stillman will be admitted to [facility] on September 25, 2005, where she will undergo a right total knee arthroplasty by Dr. Marcie Mowers.  She will undergo all the routine preoperative laboratory  tests and studies prior to this procedure.  If we have any medical issues  while she is hospitalized, we will consult one of the hospitalists.      Legrand Pitts Duffy, Arnetha Courser, M.D.  Electronically Signed    KED/MEDQ  D:  09/18/2005  T:  09/18/2005  Job:  [id number]

## 2011-02-06 NOTE — Op Note (Signed)
[facility]  Patient:    Anna Morris, Anna Morris Visit Number: [id number] MRN: [id number]          Service Type: SUR Location: 3E 0320 01 Attending Physician:  Vikki Ports Proc. Date: 05/10/01 Adm. Date:  05/10/2001                             Operative Report  PREOPERATIVE DIAGNOSIS:  Symptomatic cholelithiasis.  POSTOPERATIVE DIAGNOSIS:  Symptomatic cholelithiasis.  PROCEDURE:  Laparoscopic cholecystectomy.  SURGEON:  Catalina Lunger, M.D.  ANESTHESIA:  General.  DESCRIPTION OF PROCEDURE:  The patient was taken to the operating room and placed in the supine position.  After adequate anesthesia was induced using endotracheal tube, the abdomen was prepped and draped in the normal sterile fashion.  Using a transverse infraumbilical incision, I dissected down to the fascia.  The fascia was opened vertically.  An 0 Vicryl pursestring suture was placed around the fascial defect, a Hasson trocar was placed in the abdomen. The abdomen was insufflated to 15 mmHg using continuous-flow carbon dioxide. Under direct visualization, a 10 mm port was placed in the subxiphoid region. Two 5 mm ports were placed in the right abdomen.  The gallbladder was identified and retracted cephalad.  The gallbladder appeared bilobulated.  A number of filmy adhesions were taken down from the lateral wall.  The fundus of the gallbladder was grasped.  The neck of the gallbladder was retracted laterally.  The cystic duct was easily identified, and the triangle of Calot was also identified.  The small cystic artery was triply clipped and divided. The cystic duct, which measured about 1-2 mm was triply clipped and divided. The gallbladder was taken off the gallbladder bed using Bovie electrocautery and removed through the umbilical port.  Adequate hemostasis was ensured, and all trocars were removed.  The fascial defect was closed with the 0 Vicryl pursestring  suture.  Skin incisions were closed with subcuticular 4-0 Monocryl and Steri-Strips and sterile dressings were applied.  All tissues were injected using 0.5% Marcaine.  The patient tolerated the procedure well and went to PACU in good condition. Attending Physician:  Vikki Ports DD:  05/10/01 TD:  05/10/01 Job: [id number]

## 2011-02-06 NOTE — Discharge Summary (Signed)
NAME:  Anna Morris, Anna Morris NO.:  [id number]   MEDICAL RECORD NO.:  [id number]          PATIENT TYPE:  INP   LOCATION:  5018                         FACILITY:  [facility]   PHYSICIAN:  Dyke Brackett, M.D.    DATE OF BIRTH:  03/02/1917   DATE OF ADMISSION:  09/25/2005  DATE OF DISCHARGE:                                 DISCHARGE SUMMARY   ADMITTING DIAGNOSES:  Osteoarthritis of the right knee.   DISCHARGE DIAGNOSES:  1.  Osteoarthritis of the right knee.  2.  History of hypertension.  3.  History of diverticulosis.  4.  History of osteoporosis.  5.  History of glaucoma.  6.  History of frequent bladder infections.  7.  History of right ear mastoiditis.  8.  Postoperative anemia.  9.  Mild hyponatremia.   PROCEDURES:  Right total knee arthroplasty.   HISTORY:  This patient is an 77 year old white female presented to Dr.  Madelon Lips with a 3-4 year history of gradual onset of progressively worsening  right knee pain.  She has had no known injury or prior surgery to her left  knee but the pain is worsening.  Has now constant sensation which the  intensity varies.  There is a throbbing sensation of the medial joint line  with some radiation approximately into the mid thigh at times.  Pain  increases as she turns the wrong the way or catches her right foot on the  floor and then decreases with sitting down or with Tylenol Arthritis.  She  has not taken any additional medications for pain.  She does complain of the  knee catching at times as well as locking and swelling.  This keeps her up  at night.  No grinding or giving way.  She is not ambulating with any  assistive devices at this time.  She has received cortisone injections in  the past which have helped for about one year.  Unfortunately she has failed  conservative treatment and is now indicated for total joint arthroplasty.   HOSPITAL COURSE:  An 77 year old white female admitted September 25, 2005 after  appropriate  laboratory studies were obtained as well as 1 gram of Ancef IV  on call in the operating room.  She was taken to the operating room where  she underwent a right total knee arthroplasty.  She tolerated the procedure  well.  She was placed on Dilaudid PCA pump postoperatively.  In actuality  apparently she had an allergy to Ancef and was placed on Cleocin 600 mg  prior to surgery IV.  Postoperatively she was placed on Vancomycin 1 gram IV  q.12 hours.   Consultations with PT, OT, care management and rehab [facility] was ordered. CPM  from 0-90 degrees for 6-8 hours per day.  She was allowed weight bearing as  tolerated.  She was started on Lovenox 30 mg subq q.12 hours starting on  September 26, 2005.  She is allowed out of bed to chair the following day.  She  was ambulating in physical therapy.  She did have postoperative anemia and  required 2  units of packed cells which were transfused on January 8th.  The  remainder of her hospital course was uneventful.  Dopplers were obtained in  the right lower leg.  She was then discharged to __________ for continued  care.  She was discharged in improved condition.   LABORATORY DATA:  She was admitted with a hemoglobin of 13.8, hematocrit  40.2%, white count 6,400, platelet count 274,000.  Discharge hemoglobin was  12.1, hematocrit 34.8%. White count 17,100, platelets 202,000.  Preoperative  sodium was 140, potassium was 3.7, chloride 106, CO2 of 28, glucose 108, BUN  10, creatinine 0.8, calcium 9.7, total protein 6.6, albumin 3.9.  AST 19,  ALT 14, ALP 63.  Total bilirubin 0.7.  Discharge sodium 132, potassium 4.0,  chloride 104, CO2 25, glucose 124, BUN 7, creatinine 0.6 and calcium 8.4.  Urinalysis was benign for voided urine.  Blood type was 0 negative, antibody  screen negative.  Chest x-ray of 09/17/05, revealed heart is mildly  enlarged.  There is mild tortuosity and ectasia of the thoracic aorta.  The  lungs demonstrate mild changes COPD and there  are what appear to be scarring  type changes in the lung apices.  Bony structures are intact.  EKG was read  as normal sinus rhythm with minimal voltage criteria for LVH.  This may be  normal variant.   DISCHARGE INSTRUCTIONS:  She may have a regular diet as tolerated.  PT for  range of motion and strengthening of the right lower extremity.  CPM from 0-  90 degrees at least 6-8 hours per day.  May increase by 5-10 degrees a day  until she reaches 110 degrees.  She may ambulate with a walker as previously  instructed.   MEDICATIONS:  1.  Percocet 5/325 1-2 p.o. q.4h p.r.n. pain.  She will continue with her preoperative medications:  1.  Atenolol 50 mg 1 tablet b.i.d.  2.  Evista 60 mg 1 tablet q. a.m.  3.  Multivitamins 1 daily.  4.  Vitamin C 1 tablet daily.  5.  Calcium 1 tablet daily.  6.  Lasix 40 mg p.o. q. a.m. p.r.n. swelling.  7.  Patanol ophthalmic solution 0.1% one drop both eyes b.i.d.  8.  Cipro drops to left ear.  9.  Metamucil 1 tablespoon h.s.  10. Lovenox 40 mg subq daily for 9 days.   She will follow back up with Dr. Madelon Lips two weeks from her surgery for re-  evaluation.  If she has increase in temperatures, drainage, redness or  increasing pain, she is to return earlier.      Oris Drone Santiago Bumpers, P.A.-C.      Dyke Brackett, M.D.  Electronically Signed    BDP/MEDQ  D:  09/29/2005  T:  09/29/2005  Job:  [id number]

## 2011-02-21 ENCOUNTER — Emergency Department (HOSPITAL_COMMUNITY): Payer: Medicare Other

## 2011-02-21 ENCOUNTER — Emergency Department (HOSPITAL_COMMUNITY)
Admission: EM | Admit: 2011-02-21 | Discharge: 2011-02-21 | Disposition: A | Discharge status: Home / Self Care | Payer: Medicare Other | Attending: Emergency Medicine | Admitting: Emergency Medicine

## 2011-02-21 DIAGNOSIS — N390 Urinary tract infection, site not specified: Secondary | ICD-10-CM | POA: Insufficient documentation

## 2011-02-21 DIAGNOSIS — Z853 Personal history of malignant neoplasm of breast: Secondary | ICD-10-CM | POA: Insufficient documentation

## 2011-02-21 DIAGNOSIS — I10 Essential (primary) hypertension: Secondary | ICD-10-CM | POA: Insufficient documentation

## 2011-02-21 DIAGNOSIS — M545 Low back pain, unspecified: Secondary | ICD-10-CM | POA: Insufficient documentation

## 2011-02-21 DIAGNOSIS — Z9889 Other specified postprocedural states: Secondary | ICD-10-CM | POA: Insufficient documentation

## 2011-02-21 DIAGNOSIS — Z90710 Acquired absence of both cervix and uterus: Secondary | ICD-10-CM | POA: Insufficient documentation

## 2011-02-21 LAB — URINALYSIS, ROUTINE W REFLEX MICROSCOPIC
Nitrite: NEGATIVE
Protein, ur: NEGATIVE mg/dL
Specific Gravity, Urine: 1.013 (ref 1.005–1.030)
Urobilinogen, UA: 0.2 mg/dL (ref 0.0–1.0)

## 2011-02-21 LAB — URINE MICROSCOPIC-ADD ON

## 2011-02-28 ENCOUNTER — Emergency Department (HOSPITAL_COMMUNITY): Payer: Medicare Other

## 2011-02-28 ENCOUNTER — Inpatient Hospital Stay (HOSPITAL_COMMUNITY)
Admission: EM | Admit: 2011-02-28 | Discharge: 2011-03-04 | DRG: 392 | Disposition: A | Discharge status: Skilled Nursing Facility | Payer: Medicare Other | Attending: Internal Medicine | Admitting: Internal Medicine

## 2011-02-28 DIAGNOSIS — M810 Age-related osteoporosis without current pathological fracture: Secondary | ICD-10-CM | POA: Diagnosis present

## 2011-02-28 DIAGNOSIS — T50995A Adverse effect of other drugs, medicaments and biological substances, initial encounter: Secondary | ICD-10-CM | POA: Diagnosis present

## 2011-02-28 DIAGNOSIS — Z66 Do not resuscitate: Secondary | ICD-10-CM | POA: Diagnosis present

## 2011-02-28 DIAGNOSIS — Z96659 Presence of unspecified artificial knee joint: Secondary | ICD-10-CM

## 2011-02-28 DIAGNOSIS — E871 Hypo-osmolality and hyponatremia: Secondary | ICD-10-CM | POA: Diagnosis present

## 2011-02-28 DIAGNOSIS — R627 Adult failure to thrive: Secondary | ICD-10-CM | POA: Diagnosis present

## 2011-02-28 DIAGNOSIS — Z853 Personal history of malignant neoplasm of breast: Secondary | ICD-10-CM

## 2011-02-28 DIAGNOSIS — R109 Unspecified abdominal pain: Secondary | ICD-10-CM | POA: Diagnosis present

## 2011-02-28 DIAGNOSIS — F29 Unspecified psychosis not due to a substance or known physiological condition: Secondary | ICD-10-CM | POA: Diagnosis present

## 2011-02-28 DIAGNOSIS — Z87440 Personal history of urinary (tract) infections: Secondary | ICD-10-CM

## 2011-02-28 DIAGNOSIS — R197 Diarrhea, unspecified: Secondary | ICD-10-CM | POA: Diagnosis present

## 2011-02-28 DIAGNOSIS — K5732 Diverticulitis of large intestine without perforation or abscess without bleeding: Principal | ICD-10-CM | POA: Diagnosis present

## 2011-02-28 DIAGNOSIS — Z8614 Personal history of Methicillin resistant Staphylococcus aureus infection: Secondary | ICD-10-CM

## 2011-02-28 LAB — DIFFERENTIAL
Basophils Absolute: 0.1 10*3/uL (ref 0.0–0.1)
Lymphs Abs: 1.3 10*3/uL (ref 0.7–4.0)
Monocytes Absolute: 1.1 10*3/uL — ABNORMAL HIGH (ref 0.1–1.0)
Monocytes Relative: 18 % — ABNORMAL HIGH (ref 3–12)
Neutro Abs: 3.2 10*3/uL (ref 1.7–7.7)

## 2011-02-28 LAB — CBC
MCHC: 34.1 g/dL (ref 30.0–36.0)
Platelets: 295 10*3/uL (ref 150–400)
RDW: 13.8 % (ref 11.5–15.5)
WBC: 6 10*3/uL (ref 4.0–10.5)

## 2011-02-28 LAB — URINE MICROSCOPIC-ADD ON

## 2011-02-28 LAB — URINALYSIS, ROUTINE W REFLEX MICROSCOPIC
Glucose, UA: NEGATIVE mg/dL
Ketones, ur: NEGATIVE mg/dL
Nitrite: NEGATIVE
Specific Gravity, Urine: 1.016 (ref 1.005–1.030)
pH: 6 (ref 5.0–8.0)

## 2011-02-28 LAB — COMPREHENSIVE METABOLIC PANEL
AST: 49 U/L — ABNORMAL HIGH (ref 0–37)
Albumin: 3.8 g/dL (ref 3.5–5.2)
Alkaline Phosphatase: 157 U/L — ABNORMAL HIGH (ref 39–117)
Chloride: 96 mEq/L (ref 96–112)
Creatinine, Ser: 0.8 mg/dL (ref 0.4–1.2)
Potassium: 4.1 mEq/L (ref 3.5–5.1)
Total Bilirubin: 0.4 mg/dL (ref 0.3–1.2)
Total Protein: 7.5 g/dL (ref 6.0–8.3)

## 2011-02-28 LAB — LIPASE, BLOOD: Lipase: 40 U/L (ref 11–59)

## 2011-02-28 MED ORDER — IOHEXOL 300 MG/ML  SOLN
100.0000 mL | Freq: Once | INTRAMUSCULAR | Status: AC | PRN
  Administered 2011-02-28: 100 mL via INTRAVENOUS

## 2011-03-01 ENCOUNTER — Inpatient Hospital Stay (HOSPITAL_COMMUNITY): Payer: Medicare Other

## 2011-03-01 LAB — URINE CULTURE: Colony Count: 6000

## 2011-03-01 LAB — DIFFERENTIAL
Basophils Absolute: 0 10*3/uL (ref 0.0–0.1)
Basophils Relative: 0 % (ref 0–1)
Eosinophils Absolute: 0.3 10*3/uL (ref 0.0–0.7)
Eosinophils Relative: 6 % — ABNORMAL HIGH (ref 0–5)
Monocytes Absolute: 0.7 10*3/uL (ref 0.1–1.0)
Monocytes Relative: 13 % — ABNORMAL HIGH (ref 3–12)
Neutro Abs: 2.9 10*3/uL (ref 1.7–7.7)

## 2011-03-01 LAB — COMPREHENSIVE METABOLIC PANEL
ALT: 28 U/L (ref 0–35)
Albumin: 2.8 g/dL — ABNORMAL LOW (ref 3.5–5.2)
Alkaline Phosphatase: 151 U/L — ABNORMAL HIGH (ref 39–117)
Calcium: 8.8 mg/dL (ref 8.4–10.5)
GFR calc Af Amer: >60 mL/min (ref >60)
Glucose, Bld: 90 mg/dL (ref 70–99)
Potassium: 3.9 mEq/L (ref 3.5–5.1)
Sodium: 135 mEq/L (ref 135–145)
Total Protein: 5.8 g/dL — ABNORMAL LOW (ref 6.0–8.3)

## 2011-03-01 LAB — CBC
Hemoglobin: 10.7 g/dL — ABNORMAL LOW (ref 12.0–15.0)
MCH: 29.7 pg (ref 26.0–34.0)
MCHC: 33.8 g/dL (ref 30.0–36.0)
Platelets: 243 10*3/uL (ref 150–400)
RDW: 13.8 % (ref 11.5–15.5)

## 2011-03-01 LAB — TSH: TSH: 2.563 u[IU]/mL (ref 0.350–4.500)

## 2011-03-01 NOTE — H&P (Signed)
Anna Morris, GRANDBERRY NO.:  [id number]  MEDICAL RECORD NO.:  [id number]  LOCATION:  WLED                         FACILITY:  [facility]  PHYSICIAN:  Michiel Cowboy, MDDATE OF BIRTH:  12-06-1916  DATE OF ADMISSION:  02/28/2011 DATE OF DISCHARGE:                             HISTORY & PHYSICAL   ATTENDING PHYSICIAN:  Michiel Cowboy, M.D.  PRIMARY CARE PROVIDER:  Pearla Dubonnet, M.D.  CHIEF COMPLAINT:  Low back pain and abdominal pain as well as generalized fatigue and poor appetite.  HISTORY OF PRESENT ILLNESS:  Patient is a 77 year old female with past history of hypertension, osteoporosis and remote history of breast cancer, status post right-sided mastectomy.  Patient lives at home by herself.  About a week ago or so, she has suffered a fall when she was trying to hit the flank in her yard with Garden tools.  She lost balance and fell.  Since then, she had been having a lot of back pain, knee pain, elbow pain with occasional tingling in her thumb and in one other finger which she also injured during this fall.  The low back pain on the left side seem to bother her the most in the past and on February 21, 2011, she actually went to Emergency Department and had this evaluated. She had left hip films as well as lumbar films, which did not show any acute fractures.  Her pain is usually well controlled with Tylenol extra strength.  Next, for the past few days, she developed lower abdominal symptoms, suprapubic pain and feeling poorly.  She presented to her primary care provider, who felt maybe she has exacerbation of her diverticulitis and prescribed the ciprofloxacin and Flagyl.  She took since Thursday.  At first, her symptoms improved, but then she continued to still have lower abdominal pain and started to have decreased p.o. intake, hardly drank anything today at all.  She became weak.  Over difficulty getting off a chair because of fatigue and was  brought into Emergency Department.  In ER, she was found to have likely seems to be urinary tract infection, at which point, triad hospitalist was called for an admission.  REVIEW OF SYSTEMS:  Otherwise review of systems, no chest pain, no shortness of breath, just generalized malaise and pain from the fall. Her family reports perhaps subjective fevers, but otherwise review of systems unremarkable, 10 systems reviewed.  PAST MEDICAL HISTORY:  Significant for: 1. Breast cancer, status post right mastectomy, in remission. 2. Herniated disk. 3. Hypertension. 4. Osteoporosis. 5. History of urinary retention.  SOCIAL HISTORY:  Patient does not smoke or drink or abuse drugs.  She lives alone by herself, which is her wishes.  FAMILY HISTORY:  Noncontributory.  ALLERGIES:  Her allergy list is not very clear.  She cannot remember exactly what she is allergic to, but thinks a lot of the antibiotics give her diarrhea.  Her family thinks most likely the only true allergies are SULFA, PENICILLIN, CIPRO and FLAGYL in her allergy least, but she has been taking them successfully without any trouble.  There is also a possibility of ERYTHROMYCIN reaction and a reaction to NITROFURANTOIN.  Again, the family  is not sure if it is true and what kind of reaction she has had.  She already received ceftriaxone in the Emergency Department without any complications.  She denies any history of severe reaction to any antibiotics.  MEDICATIONS: 1. She has recently been put on Cipro 500 twice a day and Flagyl, dose     is unclear. 2. Atenolol 50 mg b.i.d. 3. Ocuvite 1 tablet daily. 4. Boniva 150 mg every month. 5. Aspirin 81 mg daily. 6. Tamsulosin 0.4 mg daily. 7. Lasix in all days except Wednesdays and Sundays. 8. Potassium 10 mEq on all days except Wednesdays and Sundays. 9. Multivitamins. 10.Vitamin D3 2000 units daily. 11.Fish oil 1200 mg daily. 12.Calcium 1200 mg daily. 13.Zyrtec 10 mg  daily.  PHYSICAL EXAMINATION:  VITAL SIGNS:  Temperature 99.3, blood pressure 143/51, pulse 66, respirations 16, satting 98% on room air. GENERAL:  Patient appears to be currently in no acute disease. HEENT:  Head nontraumatic.  Slightly dry mucous membranes. SKIN:  Decreased skin turgor. LUNGS:  Clear to auscultation bilaterally. HEART:  Regular rhythm.  No murmurs appreciated. ABDOMEN:  Soft, nontender, nondistended. EXTREMITIES:  No clubbing, cyanosis or edema noted.  There are scars on bilateral knee, consistent bilateral knee replacements and some point tenderness over left sacroiliac joint and also some tenderness over the right elbow where she fell. NEUROLOGICALLY:  She appears to move all four extremities and appears to be neurologically intact.  LABORATORY DATA:  White blood cell count 6.0, hemoglobin 12.5.  Sodium 132, potassium 4.1, creatinine 0.8, alk phos 151, AST 49, calcium 10.2, lipase 40.  UA showing 21-50 white blood cells, but rare bacteria.  DIAGNOSTIC STUDIES:  CT scan showing no changes except progressive dilatation of common bile duct of unclear etiology.  No stone noted and patient is status post cholecystectomy.  She has had hip films as well as lumbar spine films on February 21, 2011 which are both unremarkable.  ASSESSMENT AND PLAN:  This is a 77 year old female, who presents with malaise and what seems to be urinary tract infection. 1. Urinary tract infection:  She tolerated Rocephin, we will continue     for now.  Await urine culture.  She was on Cipro before this and     this could have been partially treated since patient has had a     computed tomography scan of her abdomen, which does not show any     evidence of diverticulitis.  We will hold off on this.  Also, we     will hold off on Cipro and Flagyl. 2. Recent fall:  Continue her Tylenol as needed for pain since that     seems to help.  We will order physical therapy and occupational     therapy  evaluation that can also help with evaluation for safety at     home.  Patient is adamant that she does not wish to be placed. 3. Hyponatremia:  She had a history of this in the past and in the     past her hydrochlorothiazide was stopped in attempts to improve her     sodium and her sodium is still slightly low.  We will see if gentle     intravenous hydration would help.  She seems to be little bit on     dry side today.  We will also order urine, lytes.  Check TSH. 4. Prophylaxis:  We will write for Protonix and Lovenox. 5. Code status:  Patient is do not  resuscitate, do not intubate.  I have spent a total of 1 hour on this admission.     Michiel Cowboy, MD     AVD/MEDQ  D:  02/28/2011  T:  02/28/2011  Job:  [id number]  cc:   Pearla Dubonnet, M.D. Fax: [phone]  Electronically Signed by Therisa Doyne MD on 03/01/2011 02:46:45 AM

## 2011-03-02 LAB — CLOSTRIDIUM DIFFICILE BY PCR: Toxigenic C. Difficile by PCR: NEGATIVE

## 2011-03-03 ENCOUNTER — Inpatient Hospital Stay (HOSPITAL_COMMUNITY): Payer: Medicare Other

## 2011-03-03 LAB — BASIC METABOLIC PANEL
CO2: 19 mEq/L (ref 19–32)
Chloride: 106 mEq/L (ref 96–112)
Creatinine, Ser: 0.49 mg/dL (ref 0.4–1.2)
Glucose, Bld: 95 mg/dL (ref 70–99)
Sodium: 136 mEq/L (ref 135–145)

## 2011-03-03 LAB — CBC
HCT: 33.6 % — ABNORMAL LOW (ref 36.0–46.0)
MCH: 30.1 pg (ref 26.0–34.0)
MCV: 87.3 fL (ref 78.0–100.0)
RBC: 3.85 MIL/uL — ABNORMAL LOW (ref 3.87–5.11)
WBC: 6.6 10*3/uL (ref 4.0–10.5)

## 2011-03-15 NOTE — Discharge Summary (Signed)
Anna Morris, [name] NO.:  [id number]  MEDICAL RECORD NO.:  [id number]  LOCATION:  1504                         FACILITY:  [facility]  PHYSICIAN:  Pearla Dubonnet, M.D.DATE OF BIRTH:  December 13, 1916  DATE OF ADMISSION:  02/28/2011 DATE OF DISCHARGE:  03/04/2011                              DISCHARGE SUMMARY   DISCHARGE DIAGNOSES: 1. Abdominal pain, resolved.  Questionable etiology.  Had been treated     for several days for presumptive diverticulitis. 2. Recurrent urinary tract infections. 3. History of right-sided mastectomy for breast cancer, no known     recurrence. 4. Hypertension. 5. Osteoporosis. 6. Allergies. 7. Mild hyponatremia, resolved. 8. No code blue status. 9. Mild confusion, likely secondary to age and sundowning, improving. 10.Loose stools, improving at discharge.  May have been secondary to     antibiotic associated diarrhea.  C difficile negative. 11.Methicillin resistant Staphylococcus aureus positive nasal swab,     currently on mupirocin 2% ointment applied nasally twice daily for     a total of 5 days and for 3 more days after discharge.  No known     active methicillin resistant Staphylococcus aureus infections.  ALLERGIES:  CODEINE and SULFA cause nausea and vomiting.  AUGMENTIN, DARVOCET, ERYTHROMYCIN, FLAGYL, CIPRO and LEVAQUIN have all caused diarrhea.  DISCHARGE MEDICATIONS: 1. Atenolol 25 mg b.i.d. with doses prior to hospitalization at 50 mg     b.i.d. 2. Bactroban one application nasally b.i.d. for 3 days. 3. Protonix 40 mg daily, new medication and may not need to be     continued chronically. 4. Florastor 250 mg b.i.d. for 1 week, then discontinue. 5. Aspirin 81 mg daily. 6. Bengay cream applied topically to sore muscles daily as needed. 7. Boniva 150 mg orally monthly. 8. Calcium carbonate 1200/600 one tablet daily. 9. Multivitamin daily. 10.Ocuvite one p.o. b.i.d. 11.Tamsulosin 0.4 mg p.o. q.a.m. 12.Tylenol Extra  Strength 500 mg t.i.d. as needed for pain. 13.Vitamin D3 2000 units orally daily. 14.Zyrtec 10 mg orally at bedtime.  PHYSICAL EXAMINATION:  At discharge, temperature 98.2, pulse 54 and regular, respirations 18 and nonlabored, blood pressure 162/79.  O2 saturation on room air 98%.  LABORATORY DATA:  On discharge, white count 6600, hemoglobin 11.6, platelets were clumped, but count appears adequate.  Sodium 136, potassium 3.8, chloride 106, bicarb 19, glucose 95, BUN 6, creatinine 0.49 and calcium 8.7.  Clostridium difficile toxin on stool from March 02, 2011, was negative and nasal PCR screening for MRSA was positive on March 02, 2011.  Urine culture from February 28, 2011, revealed 6000 colonies, insignificant growth.  Other laboratories from March 01, 2011, revealed a TSH of 2.563, phosphorus of 2.8, magnesium of 2.0 and comprehensive metabolic profile was completely normal as well except for a slightly elevated alkaline phosphatase of 151.  DIAGNOSTIC DATA: 1. Abdominal x-ray of the abdomen performed on March 03, 2011, revealed     no bowel obstruction.  There was some remaining contrast diffusely     and colonic diverticula.  The patient does have diffuse     diverticulosis. 2. Lumbar spine film from March 01, 2011, revealed a remote superior     endplate compression fracture at  L2 and degenerative disk disease     at L4-5. 3. CT scan of the abdomen and pelvis on February 28, 2011, revealed colonic     diverticulosis and wall thickening involving the sigmoid colon.  No     complications of diverticulitis identified.  There was no     significant surrounding inflammatory change near the wall     thickening in the sigmoid colon.  There were some liver cysts noted     and a right inguinal hernia contained nonobstructive loops of small     bowel.  HOSPITAL COURSE:  Anna Morris is a very pleasant 77 year old female with a history of hypertension, osteoporosis and remote history of breast  cancer and also right-sided mastectomy.  She has had bilateral knee replacements in the past.  She fell about a week prior to admission and hit her left side/flank.  She went to the emergency room for evaluation and she had left hip films and lumbar films and no acute fractures were noted.  For several days prior to admission, she developed lower abdominal discomfort with suprapubic pain and was seen in the office at [facility] and it was felt that she likely had mild diverticulitis and was started on Cipro and Flagyl.  At home, she could not tolerate these medicines well orally and became somewhat dehydrated and more weak and presented to the [facility] Emergency Room due to fatigue and poor p.o. intake.  She was admitted and CT scan of the abdomen and pelvis did not reveal any acute diverticulitis, but thickening of her sigmoid colon.  Initially she was felt to possibly have urinary tract infection, but urine culture grew only 6000 colonies of bacteria.  She is treated with IV fluids and antibiotics were and she has remained afebrile for 48 hours.  She has had some loose stools that have improved with a lactose-free diet.  Stool screen was negative for C difficile and she was started on Florastor and though she continues to have soft stools, she has only had 2 stools in the past 24 hours.  Stool studies were attempted to be obtained for enteric pathogens, Giardia and LMP, but the stools were contaminated with urine.  She did notice there was some confusion at nights and she was felt to be sundowning secondary to age.  Plans are to transfer to [facility] for continued rehab and strengthening.  She will be on a lactose-free diet and she will remain on Florastor as a probiotic to help her loose stools.  If loose stools continue, may need further stool studies and perhaps a sigmoidoscopy to evaluate the sigmoid colon area.  At her age, she could be at risk for a  neoplasm.  Prior to hospitalization, she was on Lasix and potassium.  This was used to treat elevated blood pressures and fluid retention.  No known history of congestive heart failure.  She will remain off those medications for now, but may need to be restarted.  She will need physical therapy and occupational therapy to help her mobilize.  She continues on Tylenol for DJD pain involving the knees for which she has had bilateral knee replacements.  She had a left total knee replacement in December 2011, a right inguinal hernia repair in February 2009 and a right total knee replacement in January 2007.  Other diagnoses include osteoporosis and mild to moderate hearing loss.  PAST SURGICAL HISTORY:  As mentioned above including bilateral knee replacements, appendectomy, tonsillectomy, hysterectomy, right ear  surgery secondary to mastoid problems in 1970, mastectomy in 1975 and removal of a ruptured lumbar disk in 1996 by Dr. Maeola Harman.  She has also had a hemorrhoidectomy and right inguinal hernia repair as mentioned and a laparoscopic cholecystectomy in the past by Dr. Luan Pulling, 2002.  SOCIAL HISTORY:  She does live alone in a one-story house.  Plans are to get her back to her home after reconditioning at [facility].  TIME SPENT:  Time spent on this dictation, reviewing the chart, examining the patient and performing discharge summary was 35 minutes.     Pearla Dubonnet, M.D.     RNG/MEDQ  D:  03/04/2011  T:  03/04/2011  Job:  [id number]  Electronically Signed by Marden Noble M.D. on 03/15/2011 08:13:42 AM

## 2011-03-17 ENCOUNTER — Ambulatory Visit (INDEPENDENT_AMBULATORY_CARE_PROVIDER_SITE_OTHER): Payer: Medicare Other | Admitting: Obstetrics and Gynecology

## 2011-03-17 DIAGNOSIS — N390 Urinary tract infection, site not specified: Secondary | ICD-10-CM

## 2011-03-17 DIAGNOSIS — R82998 Other abnormal findings in urine: Secondary | ICD-10-CM

## 2011-03-17 DIAGNOSIS — N8111 Cystocele, midline: Secondary | ICD-10-CM

## 2011-03-17 DIAGNOSIS — R350 Frequency of micturition: Secondary | ICD-10-CM

## 2011-03-19 ENCOUNTER — Other Ambulatory Visit (INDEPENDENT_AMBULATORY_CARE_PROVIDER_SITE_OTHER): Payer: Medicare Other

## 2011-03-19 DIAGNOSIS — N390 Urinary tract infection, site not specified: Secondary | ICD-10-CM

## 2011-06-12 LAB — DIFFERENTIAL
Lymphocytes Relative: 29
Monocytes Absolute: 0.6
Monocytes Relative: 10
Neutro Abs: 3.8
Neutrophils Relative %: 59

## 2011-06-12 LAB — CBC
Hemoglobin: 13.2
RBC: 4.09
WBC: 6.4

## 2011-06-12 LAB — BASIC METABOLIC PANEL
Calcium: 9.9
GFR calc non Af Amer: >60
Potassium: 3.8

## 2011-07-10 ENCOUNTER — Encounter (INDEPENDENT_AMBULATORY_CARE_PROVIDER_SITE_OTHER): Payer: Medicare Other | Admitting: Ophthalmology

## 2011-10-06 ENCOUNTER — Encounter: Payer: Self-pay | Admitting: Gynecology

## 2011-10-06 DIAGNOSIS — R339 Retention of urine, unspecified: Secondary | ICD-10-CM | POA: Insufficient documentation

## 2011-10-06 DIAGNOSIS — M8580 Other specified disorders of bone density and structure, unspecified site: Secondary | ICD-10-CM | POA: Insufficient documentation

## 2011-10-06 DIAGNOSIS — N819 Female genital prolapse, unspecified: Secondary | ICD-10-CM | POA: Insufficient documentation

## 2011-10-06 DIAGNOSIS — IMO0002 Reserved for concepts with insufficient information to code with codable children: Secondary | ICD-10-CM | POA: Insufficient documentation

## 2011-10-07 ENCOUNTER — Ambulatory Visit (INDEPENDENT_AMBULATORY_CARE_PROVIDER_SITE_OTHER): Payer: Medicare Other | Admitting: Obstetrics and Gynecology

## 2011-10-07 DIAGNOSIS — N950 Postmenopausal bleeding: Secondary | ICD-10-CM

## 2011-10-07 DIAGNOSIS — N898 Other specified noninflammatory disorders of vagina: Secondary | ICD-10-CM

## 2011-10-07 NOTE — Progress Notes (Signed)
Patient came to see me today with a one-week history of vaginal spotting. She has a pessary in place for cystocele and vault prolapse. She was last seen in June of 2012.  Exam: Anna Morris  Present: external within normal limits. BUS within normal limits. Bladder shows a third-degree cystocele. Vagina shows some atrophic changes but in addition on the left vaginal wall at the top of the cuff there are 2 white elevated lesions of approximately 3/4 of a centimeter each. Cervix and uterus are absent. Bimanual fails to reveal masses.  Assessment: #1. Postmenopausal bleeding #2. White lesion of vagina #3. Cystocele  Plan: White lesion biopsied. We will call Anna Morris with results [phone]). Pessary washed and reinserted. Monzel's used to stop bleeding.

## 2012-04-18 ENCOUNTER — Ambulatory Visit (INDEPENDENT_AMBULATORY_CARE_PROVIDER_SITE_OTHER): Payer: Medicare Other | Admitting: Gynecology

## 2012-04-18 ENCOUNTER — Encounter: Payer: Self-pay | Admitting: Gynecology

## 2012-04-18 DIAGNOSIS — N8110 Cystocele, unspecified: Secondary | ICD-10-CM

## 2012-04-18 DIAGNOSIS — N3941 Urge incontinence: Secondary | ICD-10-CM

## 2012-04-18 DIAGNOSIS — N8111 Cystocele, midline: Secondary | ICD-10-CM

## 2012-04-18 DIAGNOSIS — N390 Urinary tract infection, site not specified: Secondary | ICD-10-CM

## 2012-04-18 DIAGNOSIS — N819 Female genital prolapse, unspecified: Secondary | ICD-10-CM

## 2012-04-18 LAB — URINALYSIS W MICROSCOPIC + REFLEX CULTURE
Bilirubin Urine: NEGATIVE
Casts: NONE SEEN
Glucose, UA: NEGATIVE mg/dL
Ketones, ur: NEGATIVE mg/dL
Protein, ur: NEGATIVE mg/dL
pH: 7 (ref 5.0–8.0)

## 2012-04-18 NOTE — Patient Instructions (Signed)
Take antibiotics twice daily for one week. Return in 6 months for pessary check.

## 2012-04-18 NOTE — Progress Notes (Signed)
Patient presents for pessary cleaning. Has history of vaginal vault prolapse with cystocele and is using a ring type pessary with good results. Also noting recent onset of urinary urgency. Currently lives in assisted living facility.  Exam with [facility] assist Abdomen soft nontender without masses guarding rebound organomegaly. Pelvic external BUS vagina atrophic changes ring type pessary removed no evidence of ulceration or other abnormalities. Bimanual without masses or tenderness.  Assessment and plan: 1. Vaginal vault prolapse/cystocele. Using ring pessary which was removed cleaned and replaced. Patient doing well with this and will follow up in 6 months for recheck. 2. Urinary urgency. Urinalysis consistent with UTI. We'll treat with Macrobid 100 twice a day x7 days. Prescription was written on paperwork provided by the facility as instructed and they will provide the antibiotics.

## 2012-04-19 ENCOUNTER — Ambulatory Visit: Payer: Medicare Other | Admitting: Gynecology

## 2012-04-19 ENCOUNTER — Ambulatory Visit: Payer: Medicare Other | Admitting: Obstetrics and Gynecology

## 2012-04-21 ENCOUNTER — Telehealth: Payer: Self-pay | Admitting: Gynecology

## 2012-04-21 NOTE — Telephone Encounter (Signed)
Patient was written for Macrodantin for UTI. It appears that the bacteria is resistant to this. Unfortunately she appears to be allergic to a number of antibiotics. Check at her facility what her reaction to sulfa drugs was as this was a better choice and if it's a mild GI reaction and I might want prescribed that. If not then I want to recheck a urine culture if they can do that they're after her Macrodantin to see if the infection has persisted. It would also help if we can list her reactions to the other antibiotic medications so we know if these are serious allergic reactions or side effects that we could choose a different antibiotic such as the amoxicillin choice.

## 2012-04-22 ENCOUNTER — Telehealth: Payer: Self-pay | Admitting: *Deleted

## 2012-04-22 NOTE — Telephone Encounter (Signed)
We checked with her nursing home and verified that her allergy to sulfa drugs with nausea and vomiting. I asked them to check a urine culture when she is done with her current antibiotic. If there is a persistence of her urinary tract infection then we will use sulfa to treat. If her urinary tract infection clears with Macrobid despite showing resistance and the culture them we'll follow.

## 2012-04-22 NOTE — Telephone Encounter (Signed)
Spoke with Isaias Sakai nurse at [facility] and they will recheck urine culture once pt is done with medication and fax results to office.

## 2012-04-22 NOTE — Telephone Encounter (Signed)
Spoke with Drenda Freeze pt nurse at facility. Pt allergies are as listed: Codeine & Sulfa drugs causes nausea & vomiting, Augmentin, erythromycin, flagyl, Cipro, Levaquin, Darvocet.

## 2012-04-22 NOTE — Telephone Encounter (Signed)
l °

## 2012-05-03 ENCOUNTER — Ambulatory Visit: Payer: Medicare Other | Admitting: Obstetrics and Gynecology

## 2012-05-31 ENCOUNTER — Ambulatory Visit (INDEPENDENT_AMBULATORY_CARE_PROVIDER_SITE_OTHER): Payer: Medicare Other | Admitting: Obstetrics and Gynecology

## 2012-05-31 DIAGNOSIS — N993 Prolapse of vaginal vault after hysterectomy: Secondary | ICD-10-CM

## 2012-05-31 DIAGNOSIS — IMO0002 Reserved for concepts with insufficient information to code with codable children: Secondary | ICD-10-CM

## 2012-05-31 DIAGNOSIS — N390 Urinary tract infection, site not specified: Secondary | ICD-10-CM

## 2012-05-31 DIAGNOSIS — N8111 Cystocele, midline: Secondary | ICD-10-CM

## 2012-05-31 NOTE — Progress Notes (Signed)
Patient came back to see me today to have her pessary changed. She is aware of some vaginal discharge. She is having trouble with recurrent urinary tract infections. She is currently under the care of [facility]. Her daughter brought her last urine culture results which showed greater 100,000 colonies of enterococcus. She is on antibiotic as per the urologist and has an appointment to go back for further followup. The urologist requested that we remove, clean and replace pessary.  Anna Morris present during the exam. Pessary was removed. External exam within normal limits. BUS within normal limits. Vaginal examination shows a second-degree cystocele with first degree vaginal vault prolapse. There is slightly decreased estrogen effect. Cervix and uterus are surgically absent. Bimanual and rectal examination failed to reveal masses. The pessary was washed and reinserted.  Assessment: #1. Cystocele #2. Vaginal vault prolapse #3. Urinary tract infection  Plan: Patient to keep appointment with urologist. She will return when needed.

## 2012-05-31 NOTE — Patient Instructions (Signed)
Keep appointment with urologist

## 2012-07-11 ENCOUNTER — Encounter (INDEPENDENT_AMBULATORY_CARE_PROVIDER_SITE_OTHER): Payer: Medicare Other | Admitting: Ophthalmology

## 2012-07-11 DIAGNOSIS — H43819 Vitreous degeneration, unspecified eye: Secondary | ICD-10-CM

## 2012-07-11 DIAGNOSIS — H3530 Unspecified macular degeneration: Secondary | ICD-10-CM

## 2012-07-11 DIAGNOSIS — I10 Essential (primary) hypertension: Secondary | ICD-10-CM

## 2012-07-11 DIAGNOSIS — H35039 Hypertensive retinopathy, unspecified eye: Secondary | ICD-10-CM

## 2012-08-30 ENCOUNTER — Ambulatory Visit (INDEPENDENT_AMBULATORY_CARE_PROVIDER_SITE_OTHER): Payer: Medicare Other | Admitting: Obstetrics and Gynecology

## 2012-08-30 DIAGNOSIS — N993 Prolapse of vaginal vault after hysterectomy: Secondary | ICD-10-CM

## 2012-08-30 DIAGNOSIS — N390 Urinary tract infection, site not specified: Secondary | ICD-10-CM

## 2012-08-30 DIAGNOSIS — N8111 Cystocele, midline: Secondary | ICD-10-CM

## 2012-08-30 DIAGNOSIS — IMO0002 Reserved for concepts with insufficient information to code with codable children: Secondary | ICD-10-CM

## 2012-08-30 NOTE — Progress Notes (Signed)
Patient came to see me today to have her pessary changed. It has been 3 months since I last saw her and she starting to notice increasing vaginal discharge with an odor. She is having no vaginal bleeding. She is having no pelvic pain. Her urinary tract infections are now under control.  Exam: Anna Morris present. Her pessary was removed. Exam shows continued cystocele with vaginal vault prolapse after vaginal hysterectomy. The rest of the exam is unchanged. She has no pelvic masses on bimanual exam.  Assessment: #1. Cystocele #2. Vaginal vault prolapse #3. Urinary tract infections resolved  Plan: Pessary washed and reinserted. She'll return in approximately 4 months.

## 2012-08-30 NOTE — Patient Instructions (Signed)
Return in 4 months or sooner if there is a problem.

## 2012-09-20 IMAGING — CR DG HIP (WITH OR WITHOUT PELVIS) 2-3V*L*
3 series · 3 of 3 positions shown · non-contrast
Comparison: Lumbar spine films of 02/21/2011and CT abdomen pelvis
of 05/29/2009

CLINICAL DATA: Low back pain radiating to the left hip

LEFT HIP - COMPLETE 2+ VIEW

[t pelvis a.p.]
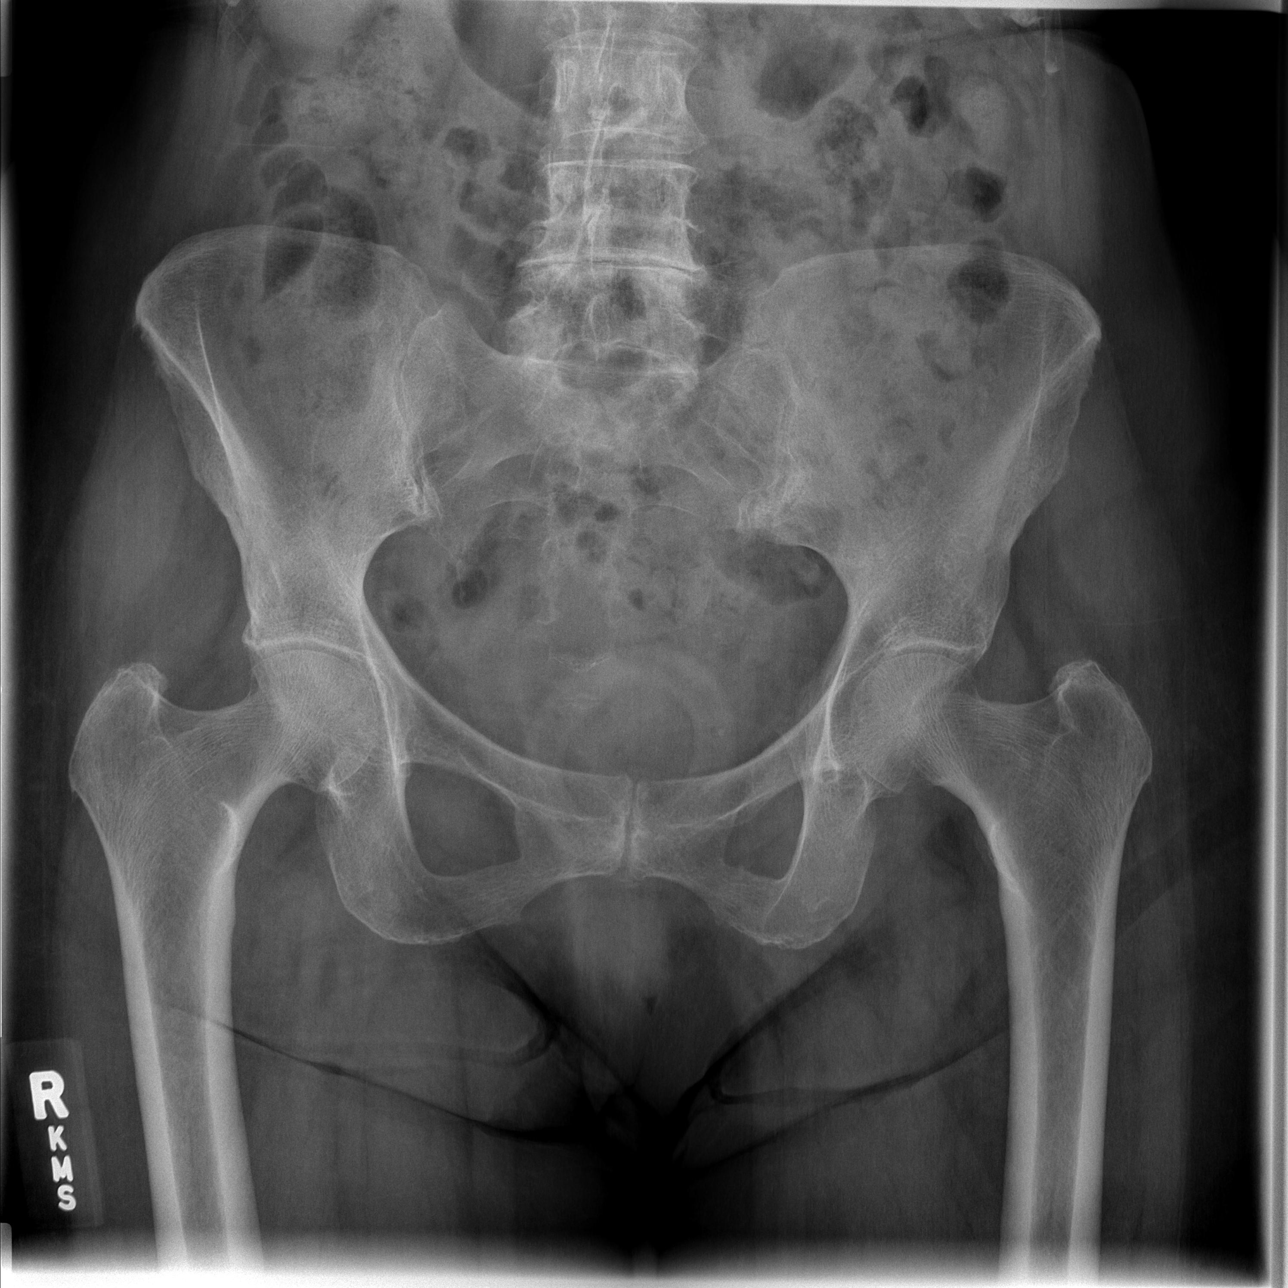

[t hip ap left]
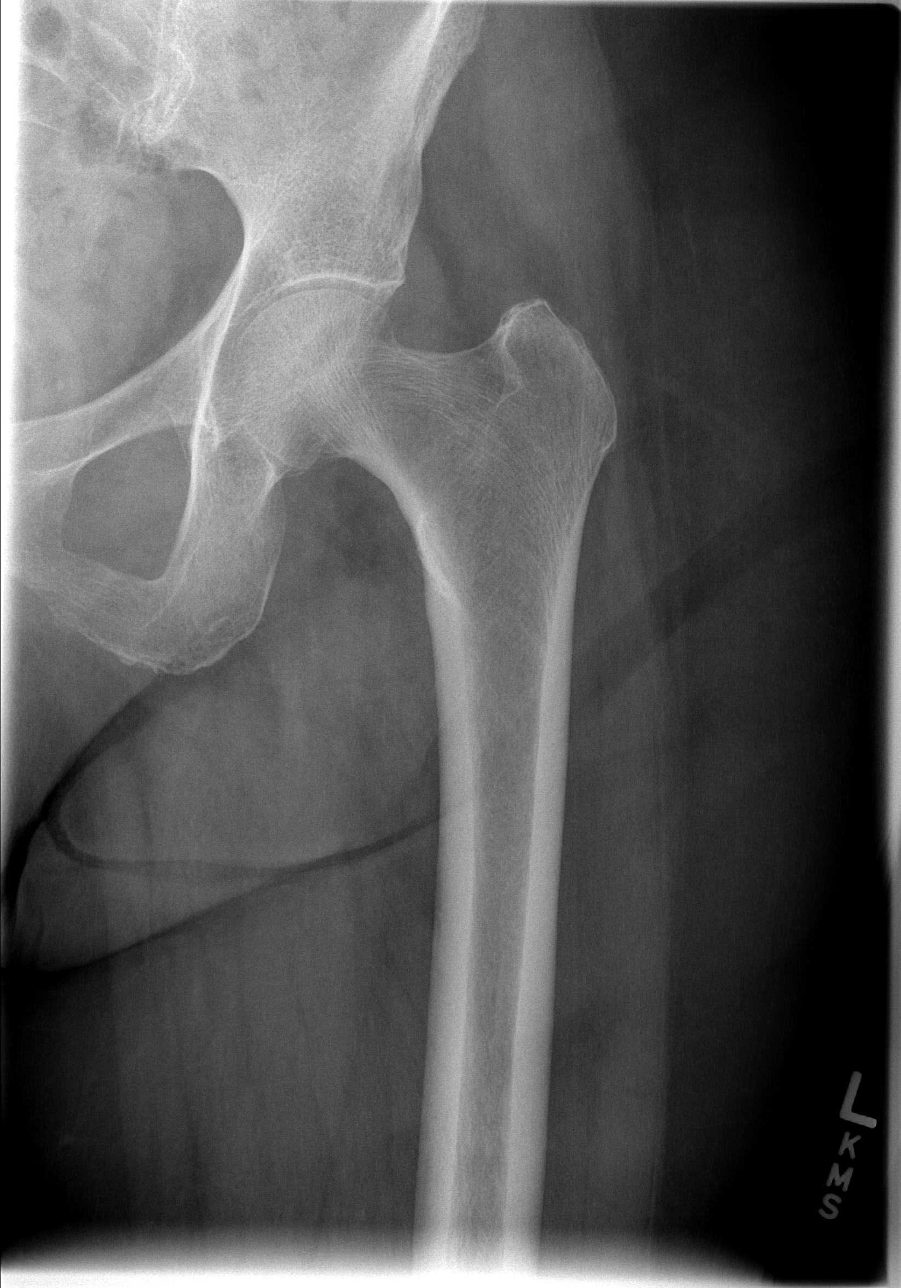

[t hip frog leg left]
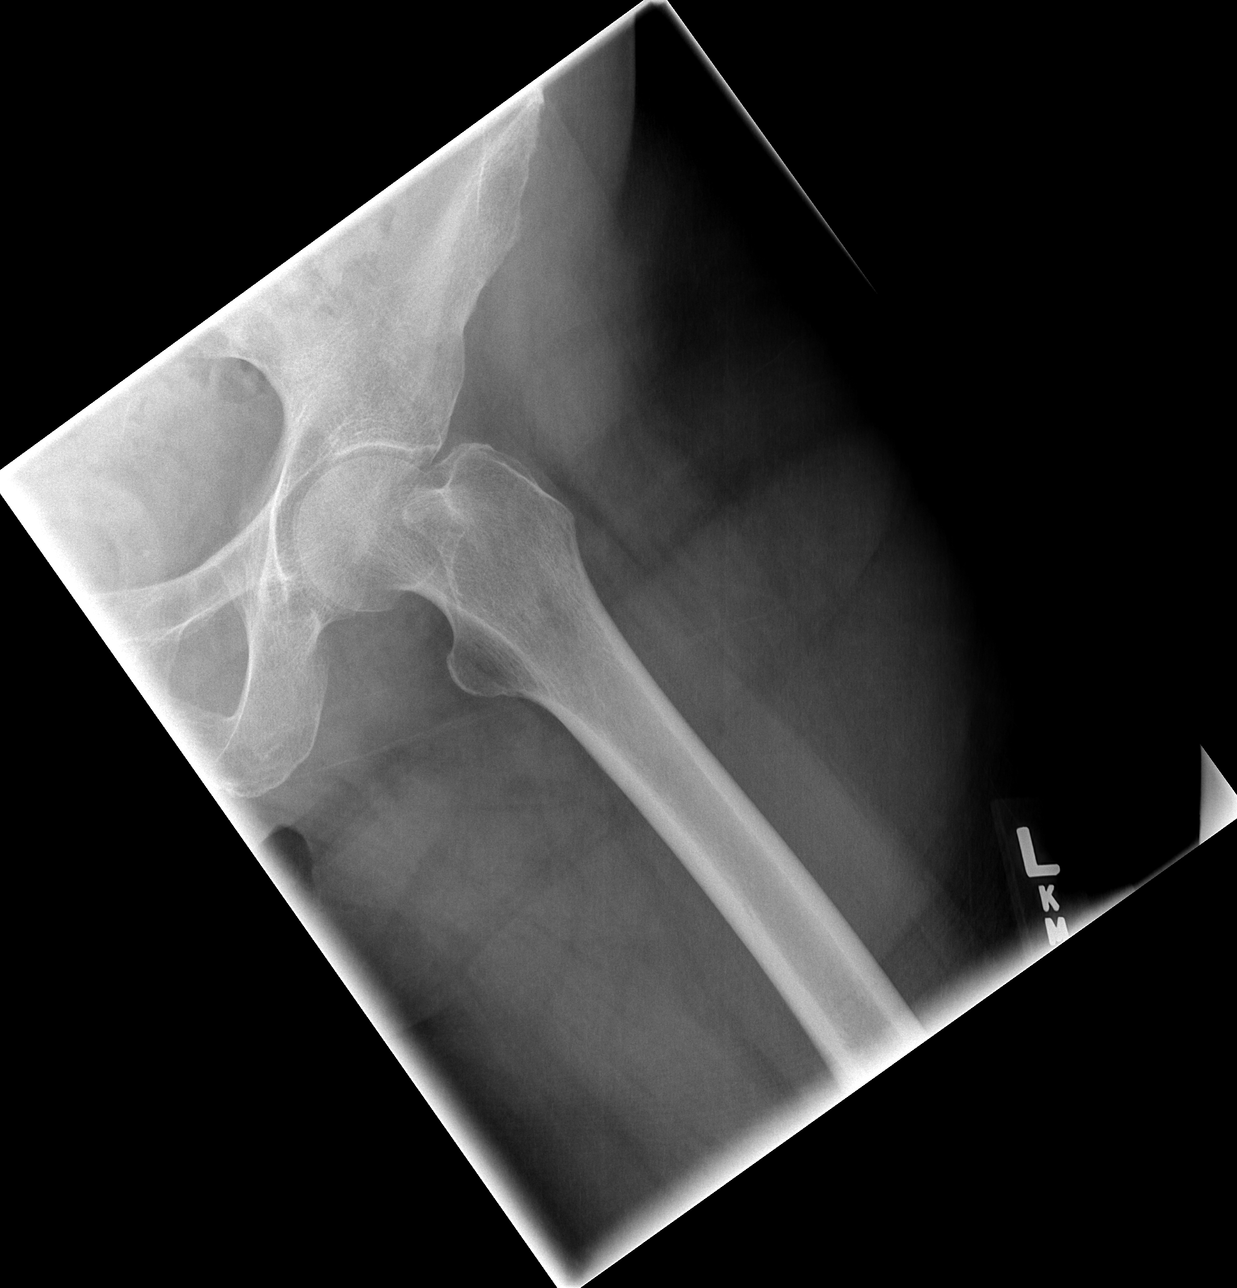

[3 of 3 positions shown; findings below may reference images not displayed]

FINDINGS: There are mild degenerative changes in the hips.  No
acute fracture is seen over the pelvic rami are intact.  Uterine
pessary is noted.
IMPRESSION: Negative left hip for fracture.

## 2012-12-29 ENCOUNTER — Ambulatory Visit (INDEPENDENT_AMBULATORY_CARE_PROVIDER_SITE_OTHER): Payer: Medicare PPO | Admitting: Gynecology

## 2012-12-29 ENCOUNTER — Encounter: Payer: Self-pay | Admitting: Gynecology

## 2012-12-29 DIAGNOSIS — Z4689 Encounter for fitting and adjustment of other specified devices: Secondary | ICD-10-CM

## 2012-12-29 DIAGNOSIS — N8111 Cystocele, midline: Secondary | ICD-10-CM

## 2012-12-29 DIAGNOSIS — N952 Postmenopausal atrophic vaginitis: Secondary | ICD-10-CM

## 2012-12-29 NOTE — Patient Instructions (Signed)
Follow up in 3 months for pessary maintenance  

## 2012-12-29 NOTE — Progress Notes (Signed)
Patient presents for pessary cleaning. Has history of vaginal vault prolapse with cystocele and is using a ring type pessary with good results.  Last cleaned in December by Dr. Eda Paschal.  Exam with Selena Batten assist  Abdomen soft nontender without masses guarding rebound organomegaly.  Pelvic external BUS vagina atrophic changes. Ring type pessary removed no evidence of ulceration or other abnormalities. Bimanual without masses or tenderness.   Assessment and plan: Vaginal vault prolapse/cystocele. Using ring pessary which was removed cleaned and replaced. Patient doing well with this and will follow up in 3 months for recheck.

## 2013-05-01 ENCOUNTER — Ambulatory Visit (INDEPENDENT_AMBULATORY_CARE_PROVIDER_SITE_OTHER): Payer: Medicare PPO | Admitting: Gynecology

## 2013-05-01 ENCOUNTER — Encounter: Payer: Self-pay | Admitting: Gynecology

## 2013-05-01 DIAGNOSIS — Z4689 Encounter for fitting and adjustment of other specified devices: Secondary | ICD-10-CM

## 2013-05-01 DIAGNOSIS — N993 Prolapse of vaginal vault after hysterectomy: Secondary | ICD-10-CM

## 2013-05-01 DIAGNOSIS — N8111 Cystocele, midline: Secondary | ICD-10-CM

## 2013-05-01 DIAGNOSIS — N952 Postmenopausal atrophic vaginitis: Secondary | ICD-10-CM

## 2013-05-01 NOTE — Patient Instructions (Signed)
Follow up for pessary recheck in 3 months

## 2013-05-01 NOTE — Progress Notes (Signed)
Patient presents for pessary removal, exam and replacement. History of vaginal vault prolapse with cystocele status post vaginal hysterectomy. Doing well as reported by her daughter-in-law who accompanies her.  Exam with Selena Batten Assistant Abdomen soft nontender without masses guarding rebound organomegaly. External BUS vagina with atrophic changes. Ring pessary removed cleaned and replaced. Vagina without evidence of erosions/irritation. Bimanual without masses or tenderness. Rectovaginal normal.  Assessment and plan: Vaginal vault prolapse with cystocele, good response to pessary. We'll continue and represent in 3-6 months for recheck.

## 2013-07-11 ENCOUNTER — Ambulatory Visit (INDEPENDENT_AMBULATORY_CARE_PROVIDER_SITE_OTHER): Payer: Medicare PPO | Admitting: Ophthalmology

## 2013-07-11 DIAGNOSIS — H3530 Unspecified macular degeneration: Secondary | ICD-10-CM

## 2013-07-11 DIAGNOSIS — H43819 Vitreous degeneration, unspecified eye: Secondary | ICD-10-CM

## 2013-07-11 DIAGNOSIS — I10 Essential (primary) hypertension: Secondary | ICD-10-CM

## 2013-07-11 DIAGNOSIS — H35039 Hypertensive retinopathy, unspecified eye: Secondary | ICD-10-CM

## 2013-07-31 ENCOUNTER — Encounter: Payer: Self-pay | Admitting: Gynecology

## 2013-07-31 ENCOUNTER — Ambulatory Visit (INDEPENDENT_AMBULATORY_CARE_PROVIDER_SITE_OTHER): Payer: Medicare PPO | Admitting: Gynecology

## 2013-07-31 DIAGNOSIS — N952 Postmenopausal atrophic vaginitis: Secondary | ICD-10-CM

## 2013-07-31 DIAGNOSIS — N8111 Cystocele, midline: Secondary | ICD-10-CM

## 2013-07-31 DIAGNOSIS — N993 Prolapse of vaginal vault after hysterectomy: Secondary | ICD-10-CM

## 2013-07-31 NOTE — Progress Notes (Signed)
Patient presents for followup pessary check history of vaginal vault prolapse and cystocele. Doing well with the pessary.  Exam with Berenice Bouton External BUS vagina with atrophic changes. Cystocele vaginal vault prolapse noted after pessary removal. No evidence of erosions or irritations. On manual exam without masses or tenderness.   Ring pessary was removed cleansed and replaced without difficulty.  Assessment and plan: Possible prolapse with cystocele historically. Good response to pessary. Patient will followup in 3-4 months for pessary recheck.

## 2013-07-31 NOTE — Patient Instructions (Signed)
Follow-up in 3-4 months for pessary recheck 

## 2013-10-31 ENCOUNTER — Ambulatory Visit (INDEPENDENT_AMBULATORY_CARE_PROVIDER_SITE_OTHER): Payer: Medicare PPO | Admitting: Gynecology

## 2013-10-31 ENCOUNTER — Encounter: Payer: Self-pay | Admitting: Gynecology

## 2013-10-31 DIAGNOSIS — N993 Prolapse of vaginal vault after hysterectomy: Secondary | ICD-10-CM

## 2013-10-31 DIAGNOSIS — N8111 Cystocele, midline: Secondary | ICD-10-CM

## 2013-10-31 NOTE — Patient Instructions (Signed)
Follow up in 3 months for pessary reexamination. 

## 2013-10-31 NOTE — Progress Notes (Signed)
Anna Morris March 14, 1917 [id number]        80 y.o.  [id number]  presents for pessary recheck with history of vaginal vault prolapse and cystocele doing well with her pessary.  Past medical history,surgical history, problem list, medications, allergies, family history and social history were all reviewed and documented in the EPIC chart.  Exam: Kim assistant General appearance  elderly, normal in appearance  External BUS vagina with atrophic changes. Cystocele with vaginal vault prolapse noted after pessary removal. No evidence of erosions or irritation. Bimanual exam without masses or tenderness.  Ring pessary was removed, cleansed and replaced without difficulty.  Assessment/Plan:  80 y.o. [id number]  with vaginal vault prolapse following hysterectomy and cystocele. Doing well with pessary. Followup in 3-4 months for pessary recheck.   Note: This document was prepared with digital dictation and possible smart phrase technology. Any transcriptional errors that result from this process are unintentional.   Anastasio Auerbach MD, 10:50 AM 10/31/2013

## 2013-12-08 ENCOUNTER — Emergency Department (HOSPITAL_COMMUNITY): Payer: Medicare PPO

## 2013-12-08 ENCOUNTER — Emergency Department (HOSPITAL_COMMUNITY)
Admission: EM | Admit: 2013-12-08 | Discharge: 2013-12-09 | Disposition: A | Discharge status: Home / Self Care | Payer: Medicare PPO | Attending: Emergency Medicine | Admitting: Emergency Medicine

## 2013-12-08 ENCOUNTER — Encounter (HOSPITAL_COMMUNITY): Payer: Self-pay | Admitting: Emergency Medicine

## 2013-12-08 DIAGNOSIS — F0390 Unspecified dementia without behavioral disturbance: Secondary | ICD-10-CM | POA: Insufficient documentation

## 2013-12-08 DIAGNOSIS — R0602 Shortness of breath: Secondary | ICD-10-CM | POA: Insufficient documentation

## 2013-12-08 DIAGNOSIS — Z7982 Long term (current) use of aspirin: Secondary | ICD-10-CM | POA: Insufficient documentation

## 2013-12-08 DIAGNOSIS — R5383 Other fatigue: Secondary | ICD-10-CM

## 2013-12-08 DIAGNOSIS — Z79899 Other long term (current) drug therapy: Secondary | ICD-10-CM | POA: Insufficient documentation

## 2013-12-08 DIAGNOSIS — R5381 Other malaise: Secondary | ICD-10-CM | POA: Insufficient documentation

## 2013-12-08 DIAGNOSIS — M949 Disorder of cartilage, unspecified: Secondary | ICD-10-CM

## 2013-12-08 DIAGNOSIS — M26609 Unspecified temporomandibular joint disorder, unspecified side: Secondary | ICD-10-CM | POA: Insufficient documentation

## 2013-12-08 DIAGNOSIS — M26629 Arthralgia of temporomandibular joint, unspecified side: Secondary | ICD-10-CM

## 2013-12-08 DIAGNOSIS — M7989 Other specified soft tissue disorders: Secondary | ICD-10-CM | POA: Insufficient documentation

## 2013-12-08 DIAGNOSIS — N390 Urinary tract infection, site not specified: Secondary | ICD-10-CM | POA: Insufficient documentation

## 2013-12-08 DIAGNOSIS — Z8742 Personal history of other diseases of the female genital tract: Secondary | ICD-10-CM | POA: Insufficient documentation

## 2013-12-08 DIAGNOSIS — Z853 Personal history of malignant neoplasm of breast: Secondary | ICD-10-CM | POA: Insufficient documentation

## 2013-12-08 DIAGNOSIS — Z8701 Personal history of pneumonia (recurrent): Secondary | ICD-10-CM | POA: Insufficient documentation

## 2013-12-08 DIAGNOSIS — Z9889 Other specified postprocedural states: Secondary | ICD-10-CM | POA: Insufficient documentation

## 2013-12-08 DIAGNOSIS — M899 Disorder of bone, unspecified: Secondary | ICD-10-CM | POA: Insufficient documentation

## 2013-12-08 LAB — COMPREHENSIVE METABOLIC PANEL
ALBUMIN: 2.8 g/dL — AB (ref 3.5–5.2)
ALT: 31 U/L (ref 0–35)
AST: 19 U/L (ref 0–37)
Alkaline Phosphatase: 127 U/L — ABNORMAL HIGH (ref 39–117)
BUN: 13 mg/dL (ref 6–23)
CO2: 24 meq/L (ref 19–32)
CREATININE: 0.74 mg/dL (ref 0.50–1.10)
Calcium: 9.5 mg/dL (ref 8.4–10.5)
Chloride: 98 mEq/L (ref 96–112)
GFR calc Af Amer: 82 mL/min — ABNORMAL LOW (ref >90)
GFR calc non Af Amer: 71 mL/min — ABNORMAL LOW (ref >90)
Glucose, Bld: 110 mg/dL — ABNORMAL HIGH (ref 70–99)
Potassium: 4 mEq/L (ref 3.7–5.3)
Sodium: 136 mEq/L — ABNORMAL LOW (ref 137–147)
Total Bilirubin: 0.8 mg/dL (ref 0.3–1.2)
Total Protein: 6.5 g/dL (ref 6.0–8.3)

## 2013-12-08 LAB — CBC WITH DIFFERENTIAL/PLATELET
BASOS ABS: 0 10*3/uL (ref 0.0–0.1)
BASOS PCT: 0 % (ref 0–1)
EOS PCT: 1 % (ref 0–5)
Eosinophils Absolute: 0.1 10*3/uL (ref 0.0–0.7)
HEMATOCRIT: 34.9 % — AB (ref 36.0–46.0)
Hemoglobin: 12.3 g/dL (ref 12.0–15.0)
Lymphocytes Relative: 10 % — ABNORMAL LOW (ref 12–46)
Lymphs Abs: 1 10*3/uL (ref 0.7–4.0)
MCH: 31.1 pg (ref 26.0–34.0)
MCHC: 35.2 g/dL (ref 30.0–36.0)
MCV: 88.4 fL (ref 78.0–100.0)
MONO ABS: 1.4 10*3/uL — AB (ref 0.1–1.0)
Monocytes Relative: 14 % — ABNORMAL HIGH (ref 3–12)
Neutro Abs: 7.6 10*3/uL (ref 1.7–7.7)
Neutrophils Relative %: 76 % (ref 43–77)
Platelets: 199 10*3/uL (ref 150–400)
RBC: 3.95 MIL/uL (ref 3.87–5.11)
RDW: 14.7 % (ref 11.5–15.5)
WBC: 10 10*3/uL (ref 4.0–10.5)

## 2013-12-08 LAB — PRO B NATRIURETIC PEPTIDE: Pro B Natriuretic peptide (BNP): 760.1 pg/mL — ABNORMAL HIGH (ref 0–450)

## 2013-12-08 LAB — URINALYSIS, ROUTINE W REFLEX MICROSCOPIC
Bilirubin Urine: NEGATIVE
Glucose, UA: NEGATIVE mg/dL
HGB URINE DIPSTICK: NEGATIVE
Ketones, ur: NEGATIVE mg/dL
Nitrite: NEGATIVE
Protein, ur: 30 mg/dL — AB
Specific Gravity, Urine: 1.021 (ref 1.005–1.030)
UROBILINOGEN UA: 2 mg/dL — AB (ref 0.0–1.0)
pH: 8.5 — ABNORMAL HIGH (ref 5.0–8.0)

## 2013-12-08 LAB — URINE MICROSCOPIC-ADD ON

## 2013-12-08 MED ORDER — CEPHALEXIN 500 MG PO CAPS: 500.0000 mg | ORAL_CAPSULE | Freq: Four times a day (QID) | ORAL | Status: DC

## 2013-12-08 NOTE — ED Notes (Signed)
Report called to [facility].

## 2013-12-08 NOTE — ED Notes (Signed)
Per EMS pt came from [facility] facility where she started having at [facility] this morning that she noted in the right side particularly with teeth, jaw, eye and cheek area. Pt had same symptoms 1 week ago and was seen at dentist for rule out dental abscess with no findings.

## 2013-12-08 NOTE — Discharge Instructions (Signed)
Temporomandibular Problems  Temporomandibular joint (TMJ) dysfunction means there are problems with the joint between your jaw and your skull. This is a joint lined by cartilage like other joints in your body but also has a small disc in the joint which keeps the bones from rubbing on each other. These joints are like other joints and can get inflamed (sore) from arthritis and other problems. When this joint gets sore, it can cause headaches and pain in the jaw and the face. CAUSES  Usually the arthritic types of problems are caused by soreness in the joint. Soreness in the joint can also be caused by overuse. This may come from grinding your teeth. It may also come from mis-alignment in the joint. DIAGNOSIS Diagnosis of this condition can often be made by history and exam. Sometimes your caregiver may need X-rays or an MRI scan to determine the exact cause. It may be necessary to see your dentist to determine if your teeth and jaws are lined up correctly. TREATMENT  Most of the time this problem is not serious; however, sometimes it can persist (become chronic). When this happens medications that will cut down on inflammation (soreness) help. Sometimes a shot of cortisone into the joint will be helpful. If your teeth are not aligned it may help for your dentist to make a splint for your mouth that can help this problem. If no physical problems can be found, the problem may come from tension. If tension is found to be the cause, biofeedback or relaxation techniques may be helpful. HOME CARE INSTRUCTIONS   Later in the day, applications of ice packs may be helpful. Ice can be used in a plastic bag with a towel around it to prevent frostbite to skin. This may be used about every 2 hours for 20 to 30 minutes, as needed while awake, or as directed by your caregiver.  Only take over-the-counter or prescription medicines for pain, discomfort, or fever as directed by your caregiver.  If physical therapy was  prescribed, follow your caregiver's directions.  Wear mouth appliances as directed if they were given. Document Released: 06/02/2001 Document Revised: 11/30/2011 Document Reviewed: 09/09/2008 [facility] Patient Information 2014 [place], [place]. Urinary Tract Infection Urinary tract infections (UTIs) can develop anywhere along your urinary tract. Your urinary tract is your body's drainage system for removing wastes and extra water. Your urinary tract includes two kidneys, two ureters, a bladder, and a urethra. Your kidneys are a pair of bean-shaped organs. Each kidney is about the size of your fist. They are located below your ribs, one on each side of your spine. CAUSES Infections are caused by microbes, which are microscopic organisms, including fungi, viruses, and bacteria. These organisms are so small that they can only be seen through a microscope. Bacteria are the microbes that most commonly cause UTIs. SYMPTOMS  Symptoms of UTIs may vary by age and gender of the patient and by the location of the infection. Symptoms in young women typically include a frequent and intense urge to urinate and a painful, burning feeling in the bladder or urethra during urination. Older women and men are more likely to be tired, shaky, and weak and have muscle aches and abdominal pain. A fever may mean the infection is in your kidneys. Other symptoms of a kidney infection include pain in your back or sides below the ribs, nausea, and vomiting. DIAGNOSIS To diagnose a UTI, your caregiver will ask you about your symptoms. Your caregiver also will ask to provide a  urine sample. The urine sample will be tested for bacteria and white blood cells. White blood cells are made by your body to help fight infection. TREATMENT  Typically, UTIs can be treated with medication. Because most UTIs are caused by a bacterial infection, they usually can be treated with the use of antibiotics. The choice of antibiotic and length of  treatment depend on your symptoms and the type of bacteria causing your infection. HOME CARE INSTRUCTIONS  If you were prescribed antibiotics, take them exactly as your caregiver instructs you. Finish the medication even if you feel better after you have only taken some of the medication.  Drink enough water and fluids to keep your urine clear or pale yellow.  Avoid caffeine, tea, and carbonated beverages. They tend to irritate your bladder.  Empty your bladder often. Avoid holding urine for long periods of time.  Empty your bladder before and after sexual intercourse.  After a bowel movement, women should cleanse from front to back. Use each tissue only once. SEEK MEDICAL CARE IF:   You have back pain.  You develop a fever.  Your symptoms do not begin to resolve within 3 days. SEEK IMMEDIATE MEDICAL CARE IF:   You have severe back pain or lower abdominal pain.  You develop chills.  You have nausea or vomiting.  You have continued burning or discomfort with urination. MAKE SURE YOU:   Understand these instructions.  Will watch your condition.  Will get help right away if you are not doing well or get worse. Document Released: 06/17/2005 Document Revised: 03/08/2012 Document Reviewed: 10/16/2011 [facility] Patient Information 2014 [place].

## 2013-12-09 NOTE — ED Provider Notes (Addendum)
CSN: [id number]     Arrival date & time 12/08/13  1940 History   First MD Initiated Contact with Patient 12/08/13 1945     Chief Complaint  Patient presents with  . Headache     (Consider location/radiation/quality/duration/timing/severity/associated sxs/prior Treatment) HPI  This is a 80 year old female with history of dementia, breast cancer, and infrequent UTIs who presents with headache. Per EMS report, patient has noted right-sided headache for greater than one week. She's been complaining at her assisted-living facility. Per report, she is seeing a dentist and dental abscess has been ruled out. Patient has dementia and when asked why she is here she states "I just feel good." She reports leg pain when she wakes up in the morning. She also reports shortness of breath but only in the morning. She denies chest pain or fevers.  Denies cough.  History is limited secondary to dementia.  Level 5 caveat for dementia.  Of note, patient's sons arrived after initiation of workup. They report that she's been complaining of right jaw pain that radiates into her right temple. She has been evaluated by a dentist. They state that normally she is very high functioning but this week they have noted that she's not wanting to get out of bed and that she appears somnolent. No noted fevers. He has not noted any weakness or unilateral focal deficits.  Past Medical History  Diagnosis Date  . Cystocele   . Osteopenia   . Vaginal vault prolapse   . Cancer     Breast cancer  . DUB (dysfunctional uterine bleeding)   . Pneumonia 2006  . Urinary retention    Past Surgical History  Procedure Laterality Date  . Breast surgery      Mastectomy  . Total knee arthroplasty    . Vaginal hysterectomy    . Hemorrhoid surgery    . Back surgery    . Cholecystectomy    . Cardiac catheterization     Family History  Problem Relation Age of Onset  . Heart disease Father   . Breast cancer Sister    History   Substance Use Topics  . Smoking status: Never Smoker   . Smokeless tobacco: Never Used  . Alcohol Use: Not on file   OB History   Grav Para Term Preterm Abortions TAB SAB Ect Mult Living   3 3 3       2      Review of Systems  Constitutional: Positive for fatigue. Negative for fever.  Respiratory: Positive for shortness of breath. Negative for cough and chest tightness.   Cardiovascular: Positive for leg swelling. Negative for chest pain.  Gastrointestinal: Negative for nausea, vomiting and abdominal pain.  Genitourinary: Negative for dysuria.  Musculoskeletal: Negative for back pain.  Skin: Negative for wound.  Neurological: Positive for headaches. Negative for dizziness, weakness, light-headedness and numbness.  Psychiatric/Behavioral: Negative for confusion.  All other systems reviewed and are negative.      Allergies  Amoxicillin-pot clavulanate; Ciprofloxacin; Erythromycin; Levofloxacin; Metronidazole; and Sulfa antibiotics  Home Medications   Current Outpatient Rx  Name  Route  Sig  Dispense  Refill  . acetaminophen (TYLENOL) 500 MG tablet   Oral   Take 500 mg by mouth every 6 (six) hours as needed.         Marland Kitchen aspirin 81 MG tablet   Oral   Take 81 mg by mouth daily.         Marland Kitchen atenolol (TENORMIN) 25 MG tablet  Oral   Take 25 mg by mouth daily.         . Calcium Carbonate-Vitamin D (CALCIUM + D PO)   Oral   Take 2 capsules by mouth daily.          . clindamycin (CLEOCIN) 150 MG capsule   Oral   Take 150 mg by mouth 4 (four) times daily as needed. Take capsules before procedure         . clindamycin (CLEOCIN) 300 MG capsule   Oral   Take 300 mg by mouth every 6 (six) hours. Take for 7 days only. Started medication on 12-02-13         . diphenhydrAMINE (BENADRYL) 25 mg capsule   Oral   Take 25 mg by mouth every 6 (six) hours as needed for allergies.          . Ergocalciferol (VITAMIN D2) 2000 UNITS TABS   Oral   Take 1 capsule by mouth  daily.         . fexofenadine (ALLEGRA) 180 MG tablet   Oral   Take 180 mg by mouth daily.         . fish oil-omega-3 fatty acids 1000 MG capsule   Oral   Take 1 g by mouth daily.         . furosemide (LASIX) 20 MG tablet   Oral   Take 20 mg by mouth daily.         . Multiple Vitamin (MULTIVITAMIN) tablet   Oral   Take 1 tablet by mouth daily.         . pantoprazole (PROTONIX) 40 MG tablet   Oral   Take 40 mg by mouth daily.         . potassium chloride (K-DUR) 10 MEQ tablet   Oral   Take 10 mEq by mouth daily.         . Probiotic Product (ALIGN PO)   Oral   Take 1 capsule by mouth daily.         . risperiDONE (RISPERDAL) 0.5 MG tablet   Oral   Take 0.5 mg by mouth 2 (two) times daily.         . sertraline (ZOLOFT) 25 MG tablet   Oral   Take 25 mg by mouth daily.         . Tamsulosin HCl (FLOMAX) 0.4 MG CAPS   Oral   Take by mouth.         . trimethoprim (TRIMPEX) 100 MG tablet   Oral   Take 100 mg by mouth 2 (two) times daily.         . cephALEXin (KEFLEX) 500 MG capsule   Oral   Take 1 capsule (500 mg total) by mouth 4 (four) times daily.   20 capsule   0    BP 139/64  Pulse 64  Temp(Src) 98.6 F (37 C) (Oral)  Resp 16  SpO2 96% Physical Exam  Nursing note and vitals reviewed. Constitutional: She is oriented to person, place, and time. No distress.  Elderly, hard of hearing  HENT:  Head: Normocephalic and atraumatic.  Mouth/Throat: Oropharynx is clear and moist.  Tenderness palpation over the right TMJ, no dislocation noted, no temporal artery tenderness  Eyes: Pupils are equal, round, and reactive to light.  Neck: Neck supple.  Cardiovascular: Normal rate, regular rhythm and normal heart sounds.   No murmur heard. Pulmonary/Chest: Effort normal and breath sounds normal. No respiratory distress. She has  no wheezes.  Abdominal: Soft. Bowel sounds are normal. There is no tenderness. There is no rebound.  Musculoskeletal:  She exhibits edema.  2+ pitting bilateral lower extremity edema  Neurological: She is alert and oriented to person, place, and time.  Confused at times, 5 strength in all 4 extremities, no dysmetria noted  Skin: Skin is warm and dry.  Psychiatric: She has a normal mood and affect.    ED Course  Procedures (including critical care time) Labs Review Labs Reviewed  CBC WITH DIFFERENTIAL - Abnormal; Notable for the following:    HCT 34.9 (*)    Lymphocytes Relative 10 (*)    Monocytes Relative 14 (*)    Monocytes Absolute 1.4 (*)    All other components within normal limits  COMPREHENSIVE METABOLIC PANEL - Abnormal; Notable for the following:    Sodium 136 (*)    Glucose, Bld 110 (*)    Albumin 2.8 (*)    Alkaline Phosphatase 127 (*)    GFR calc non Af Amer 71 (*)    GFR calc Af Amer 82 (*)    All other components within normal limits  URINALYSIS, ROUTINE W REFLEX MICROSCOPIC - Abnormal; Notable for the following:    Color, Urine AMBER (*)    APPearance CLOUDY (*)    pH 8.5 (*)    Protein, ur 30 (*)    Urobilinogen, UA 2.0 (*)    Leukocytes, UA SMALL (*)    All other components within normal limits  PRO B NATRIURETIC PEPTIDE - Abnormal; Notable for the following:    Pro B Natriuretic peptide (BNP) 760.1 (*)    All other components within normal limits  URINE MICROSCOPIC-ADD ON - Abnormal; Notable for the following:    Bacteria, UA MANY (*)    All other components within normal limits  URINE CULTURE   Imaging Review Dg Chest 2 View  12/08/2013   CLINICAL DATA:  Urinary retention.  Headache.  EXAM: CHEST  2 VIEW  COMPARISON:  DG CHEST 2 VIEW dated 08/22/2010; DG CHEST 2V dated 07/28/2010; DG CHEST 2 VIEW dated 10/24/2007; DG CHEST 2 VIEW dated 09/17/2005  FINDINGS: Cardiac silhouette mildly enlarged but stable, allowing for differences in technique. Thoracic aorta tortuous and atherosclerotic, unchanged. Dense consolidation in the left lower lobe and associated moderate-sized left  pleural effusion. Mild atelectasis in the right lung base. Pulmonary venous hypertension and mild interstitial pulmonary edema as evidence by Kerley B-lines. Small right pleural effusion. Degenerative changes involving the thoracic spine.  IMPRESSION: Stable mild cardiomegaly. Mild interstitial pulmonary edema. Left lower lobe atelectasis versus pneumonia. Bilateral pleural effusions, left greater than right.   Electronically Signed   By: Evangeline Dakin M.D.   On: 12/08/2013 21:05   Ct Head Wo Contrast  12/08/2013   CLINICAL DATA:  Right-sided headache  EXAM: CT HEAD WITHOUT CONTRAST  TECHNIQUE: Contiguous axial images were obtained from the base of the skull through the vertex without intravenous contrast.  COMPARISON:  MRI brain dated 06/01/2009  FINDINGS: No evidence of parenchymal hemorrhage or extra-axial fluid collection.  10 mm partially calcified extra-axial lesion overlying the right frontal lobe (series 2/image 22), unchanged from prior MRI, compatible with a dermoid.  No mass effect or midline shift.  No CT evidence of acute infarction.  Subcortical white matter and periventricular small vessel ischemic changes.  Age related atrophy.  No ventriculomegaly.  The visualized paranasal sinuses are essentially clear. Prior right mastoidectomy. Left mastoid air cells are clear.  No evidence  of calvarial fracture.  IMPRESSION: No evidence of acute intracranial abnormality.  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  Age related atrophy with small vessel ischemic changes.   Electronically Signed   By: Julian Hy M.D.   On: 12/08/2013 20:52     EKG Interpretation None     EKG independently reviewed by myself: Normal sinus rhythm with rate 66, no evidence of ST elevation or acute ischemia MDM   Final diagnoses:  UTI (lower urinary tract infection)  TMJ tenderness    Patient presents with primary complaint of headache. She has tenderness to palpation over the right TMJ. She  is nonfocal on exam. She does endorse generalized fatigue and leg pain worse in the morning. Workup notable for mild elevation in BNP. Chest x-ray shows mild pulmonary edema with small pleural effusions. Patient is on room air with sats are 97%. Lung exam is clear.  No significant white count or cough. Low suspicion for pneumonia. Likely mild volume overload. Patient also noted to have UTI. Head CT is negative. The patient's headache is likely secondary to TMJ. She was noted to grind her teeth during examination. We'll treat UTI with Keflex. She has multiple allergies.    Regarding lower extremity edema and mild elevation in BNP. Patient is on 20 mg of Lasix.  She's not requiring supplemental oxygen. Discuss with family increasing dose of Lasix. Given that patient is currently not symptomatic and has good vital signs, titration of Lasix will be deferred until followup with primary care. Family states they will try to set this up on Monday.  No echo in our system. May need formal cardiology echo. Feel this can be done as an outpatient.   After history, exam, and medical workup I feel the patient has been appropriately medically screened and is safe for discharge home. Pertinent diagnoses were discussed with the patient. Patient was given return precautions.     Merryl Hacker, MD 12/09/13 1739  Merryl Hacker, MD 12/09/13 [id number]  Merryl Hacker, MD 12/09/13 1745

## 2013-12-11 LAB — URINE CULTURE: Colony Count: >=100000

## 2014-02-19 DIAGNOSIS — R296 Repeated falls: Secondary | ICD-10-CM | POA: Insufficient documentation

## 2014-02-19 DIAGNOSIS — M949 Disorder of cartilage, unspecified: Secondary | ICD-10-CM

## 2014-02-19 DIAGNOSIS — J329 Chronic sinusitis, unspecified: Secondary | ICD-10-CM | POA: Insufficient documentation

## 2014-02-19 DIAGNOSIS — Y9289 Other specified places as the place of occurrence of the external cause: Secondary | ICD-10-CM | POA: Insufficient documentation

## 2014-02-19 DIAGNOSIS — Y939 Activity, unspecified: Secondary | ICD-10-CM | POA: Insufficient documentation

## 2014-02-19 DIAGNOSIS — F0390 Unspecified dementia without behavioral disturbance: Secondary | ICD-10-CM | POA: Insufficient documentation

## 2014-02-19 DIAGNOSIS — Z7982 Long term (current) use of aspirin: Secondary | ICD-10-CM | POA: Insufficient documentation

## 2014-02-19 DIAGNOSIS — N390 Urinary tract infection, site not specified: Secondary | ICD-10-CM | POA: Insufficient documentation

## 2014-02-19 DIAGNOSIS — Z792 Long term (current) use of antibiotics: Secondary | ICD-10-CM | POA: Insufficient documentation

## 2014-02-19 DIAGNOSIS — Z9889 Other specified postprocedural states: Secondary | ICD-10-CM | POA: Insufficient documentation

## 2014-02-19 DIAGNOSIS — Z79899 Other long term (current) drug therapy: Secondary | ICD-10-CM | POA: Insufficient documentation

## 2014-02-19 DIAGNOSIS — Z853 Personal history of malignant neoplasm of breast: Secondary | ICD-10-CM | POA: Insufficient documentation

## 2014-02-19 DIAGNOSIS — Z8701 Personal history of pneumonia (recurrent): Secondary | ICD-10-CM | POA: Insufficient documentation

## 2014-02-19 DIAGNOSIS — M899 Disorder of bone, unspecified: Secondary | ICD-10-CM | POA: Insufficient documentation

## 2014-02-20 ENCOUNTER — Emergency Department (HOSPITAL_COMMUNITY): Payer: Medicare PPO

## 2014-02-20 ENCOUNTER — Encounter (HOSPITAL_COMMUNITY): Payer: Self-pay | Admitting: Emergency Medicine

## 2014-02-20 ENCOUNTER — Emergency Department (HOSPITAL_COMMUNITY)
Admission: EM | Admit: 2014-02-20 | Discharge: 2014-02-20 | Disposition: A | Discharge status: Home / Self Care | Payer: Medicare PPO | Attending: Emergency Medicine | Admitting: Emergency Medicine

## 2014-02-20 DIAGNOSIS — J329 Chronic sinusitis, unspecified: Secondary | ICD-10-CM

## 2014-02-20 DIAGNOSIS — W19XXXA Unspecified fall, initial encounter: Secondary | ICD-10-CM

## 2014-02-20 DIAGNOSIS — N390 Urinary tract infection, site not specified: Secondary | ICD-10-CM

## 2014-02-20 HISTORY — DX: Unspecified dementia, unspecified severity, without behavioral disturbance, psychotic disturbance, mood disturbance, and anxiety: F03.90

## 2014-02-20 LAB — CBC WITH DIFFERENTIAL/PLATELET
BASOS PCT: 0 % (ref 0–1)
Basophils Absolute: 0 10*3/uL (ref 0.0–0.1)
EOS ABS: 0 10*3/uL (ref 0.0–0.7)
EOS PCT: 0 % (ref 0–5)
HCT: 36.2 % (ref 36.0–46.0)
Hemoglobin: 12.3 g/dL (ref 12.0–15.0)
Lymphocytes Relative: 9 % — ABNORMAL LOW (ref 12–46)
Lymphs Abs: 0.9 10*3/uL (ref 0.7–4.0)
MCH: 30.9 pg (ref 26.0–34.0)
MCHC: 34 g/dL (ref 30.0–36.0)
MCV: 91 fL (ref 78.0–100.0)
Monocytes Absolute: 1 10*3/uL (ref 0.1–1.0)
Monocytes Relative: 9 % (ref 3–12)
NEUTROS PCT: 82 % — AB (ref 43–77)
Neutro Abs: 8.6 10*3/uL — ABNORMAL HIGH (ref 1.7–7.7)
PLATELETS: 161 10*3/uL (ref 150–400)
RBC: 3.98 MIL/uL (ref 3.87–5.11)
RDW: 13.9 % (ref 11.5–15.5)
WBC: 10.5 10*3/uL (ref 4.0–10.5)

## 2014-02-20 LAB — URINALYSIS, ROUTINE W REFLEX MICROSCOPIC
BILIRUBIN URINE: NEGATIVE
Glucose, UA: NEGATIVE mg/dL
Hgb urine dipstick: NEGATIVE
KETONES UR: NEGATIVE mg/dL
Nitrite: POSITIVE — AB
Protein, ur: NEGATIVE mg/dL
SPECIFIC GRAVITY, URINE: 1.021 (ref 1.005–1.030)
UROBILINOGEN UA: 1 mg/dL (ref 0.0–1.0)
pH: 6.5 (ref 5.0–8.0)

## 2014-02-20 LAB — COMPREHENSIVE METABOLIC PANEL
ALBUMIN: 3.5 g/dL (ref 3.5–5.2)
ALK PHOS: 77 U/L (ref 39–117)
ALT: 13 U/L (ref 0–35)
AST: 17 U/L (ref 0–37)
BUN: 15 mg/dL (ref 6–23)
CALCIUM: 9.4 mg/dL (ref 8.4–10.5)
CO2: 23 mEq/L (ref 19–32)
Chloride: 99 mEq/L (ref 96–112)
Creatinine, Ser: 0.53 mg/dL (ref 0.50–1.10)
GFR calc non Af Amer: 79 mL/min — ABNORMAL LOW (ref >90)
Glucose, Bld: 144 mg/dL — ABNORMAL HIGH (ref 70–99)
POTASSIUM: 3.4 meq/L — AB (ref 3.7–5.3)
Sodium: 134 mEq/L — ABNORMAL LOW (ref 137–147)
TOTAL PROTEIN: 6.9 g/dL (ref 6.0–8.3)
Total Bilirubin: 0.6 mg/dL (ref 0.3–1.2)

## 2014-02-20 LAB — URINE MICROSCOPIC-ADD ON

## 2014-02-20 LAB — CBG MONITORING, ED: Glucose-Capillary: 132 mg/dL — ABNORMAL HIGH (ref 70–99)

## 2014-02-20 MED ORDER — LIDOCAINE HCL 1 % IJ SOLN
INTRAMUSCULAR | Status: AC
  Administered 2014-02-20: 2.1 mL
  Filled 2014-02-20: qty 20

## 2014-02-20 MED ORDER — CEFTRIAXONE SODIUM 1 G IJ SOLR
1.0000 g | Freq: Once | INTRAMUSCULAR | Status: AC
  Administered 2014-02-20: 1 g via INTRAMUSCULAR
  Filled 2014-02-20: qty 10

## 2014-02-20 MED ORDER — CEFPODOXIME PROXETIL 200 MG PO TABS: 400.0000 mg | ORAL_TABLET | Freq: Two times a day (BID) | ORAL | Status: DC

## 2014-02-20 NOTE — ED Provider Notes (Signed)
CSN: [id number]     Arrival date & time 02/19/14  2359 History   First MD Initiated Contact with Patient 02/20/14 0017     Chief Complaint  Patient presents with  . Fall     Level V caveat: Dementia  HPI Patient is brought to the emergency department after being found on the ground by nursing staff.  This was unwitnessed fall.  She was lying next to bed.  As reported that she had one episode of vomiting tonight.  Family was concerned that she may be getting another urinary tract infection as this is a she typically presents.  No current ulcer mental status.  No obvious trauma to the head.  Patient was brought to the ER by EMS.  Patient has no complaints at this time.  She states she feels rather well.  She denies chest pain or abdominal pain.  She denies hip or knee pain.   Past Medical History  Diagnosis Date  . Cystocele   . Osteopenia   . Vaginal vault prolapse   . Cancer     Breast cancer  . DUB (dysfunctional uterine bleeding)   . Pneumonia 2006  . Urinary retention   . Dementia    Past Surgical History  Procedure Laterality Date  . Breast surgery      Mastectomy  . Total knee arthroplasty    . Vaginal hysterectomy    . Hemorrhoid surgery    . Back surgery    . Cholecystectomy    . Cardiac catheterization     Family History  Problem Relation Age of Onset  . Heart disease Father   . Breast cancer Sister    History  Substance Use Topics  . Smoking status: Never Smoker   . Smokeless tobacco: Never Used  . Alcohol Use: Not on file   OB History   Grav Para Term Preterm Abortions TAB SAB Ect Mult Living   3 3 3       2      Review of Systems  Unable to perform ROS: Dementia      Allergies  Amoxicillin-pot clavulanate; Ciprofloxacin; Codeine; Erythromycin; Levofloxacin; Metronidazole; and Sulfa antibiotics  Home Medications   Prior to Admission medications   Medication Sig Start Date End Date Taking? Authorizing Provider  aspirin 81 MG tablet Take 81 mg  by mouth daily.   Yes Historical Provider, MD  atenolol (TENORMIN) 25 MG tablet Take 25 mg by mouth 2 (two) times daily.    Yes Historical Provider, MD  beta carotene w/minerals (OCUVITE) tablet Take 2 tablets by mouth 2 (two) times daily.   Yes Historical Provider, MD  calcium carbonate (OS-CAL) 600 MG TABS tablet Take 1,200 mg by mouth daily with breakfast.   Yes Historical Provider, MD  cholecalciferol (VITAMIN D) 1000 UNITS tablet Take 2,000 Units by mouth daily.   Yes Historical Provider, MD  fexofenadine (ALLEGRA) 180 MG tablet Take 180 mg by mouth daily.   Yes Historical Provider, MD  furosemide (LASIX) 20 MG tablet Take 20 mg by mouth daily.   Yes Historical Provider, MD  Multiple Vitamin (MULTIVITAMIN) tablet Take 1 tablet by mouth daily.   Yes Historical Provider, MD  pantoprazole (PROTONIX) 40 MG tablet Take 40 mg by mouth daily.   Yes Historical Provider, MD  potassium chloride (K-DUR) 10 MEQ tablet Take 10 mEq by mouth daily.   Yes Historical Provider, MD  Probiotic Product (ALIGN) 4 MG CAPS Take 1 capsule by mouth every morning.   Yes  Historical Provider, MD  risperiDONE (RISPERDAL) 0.5 MG tablet Take 0.5 mg by mouth 2 (two) times daily.   Yes Historical Provider, MD  sertraline (ZOLOFT) 25 MG tablet Take 25 mg by mouth daily.   Yes Historical Provider, MD  sodium chloride (OCEAN) 0.65 % SOLN nasal spray Place 1 spray into both nostrils 4 (four) times daily as needed (dry nostrils).   Yes Historical Provider, MD  Tamsulosin HCl (FLOMAX) 0.4 MG CAPS Take 0.4 mg by mouth daily after breakfast.    Yes Historical Provider, MD  trimethoprim (TRIMPEX) 100 MG tablet Take 100 mg by mouth at bedtime.    Yes Historical Provider, MD  acetaminophen (TYLENOL) 500 MG tablet Take 500 mg by mouth every 6 (six) hours as needed for mild pain or headache.     Historical Provider, MD  cefpodoxime (VANTIN) 200 MG tablet Take 2 tablets (400 mg total) by mouth 2 (two) times daily. 02/20/14   Hoy Morn, MD    BP 151/63  Pulse 55  Temp(Src) 98.3 F (36.8 C) (Oral)  Resp 16  SpO2 96% Physical Exam  Nursing note and vitals reviewed. Constitutional: She appears well-developed and well-nourished. No distress.  HENT:  Head: Normocephalic and atraumatic.  Eyes: EOM are normal. Pupils are equal, round, and reactive to light.  Neck: Normal range of motion. Neck supple.  No C-spine tenderness  Cardiovascular: Normal rate, regular rhythm and normal heart sounds.   Pulmonary/Chest: Effort normal and breath sounds normal.  Abdominal: Soft. She exhibits no distension and no mass. There is no tenderness. There is no rebound and no guarding.  Musculoskeletal: Normal range of motion.  Full Range of motion of bilateral knees and hips.  Full range of motion bilateral elbows, wrists and shoulders.   Neurological: She is alert.  Skin: Skin is warm and dry.  Psychiatric: She has a normal mood and affect. Judgment normal.    ED Course  Procedures (including critical care time) Labs Review Labs Reviewed  URINALYSIS, ROUTINE W REFLEX MICROSCOPIC - Abnormal; Notable for the following:    APPearance CLOUDY (*)    Nitrite POSITIVE (*)    Leukocytes, UA SMALL (*)    All other components within normal limits  CBC WITH DIFFERENTIAL - Abnormal; Notable for the following:    Neutrophils Relative % 82 (*)    Neutro Abs 8.6 (*)    Lymphocytes Relative 9 (*)    All other components within normal limits  COMPREHENSIVE METABOLIC PANEL - Abnormal; Notable for the following:    Sodium 134 (*)    Potassium 3.4 (*)    Glucose, Bld 144 (*)    GFR calc non Af Amer 79 (*)    All other components within normal limits  URINE MICROSCOPIC-ADD ON - Abnormal; Notable for the following:    Bacteria, UA MANY (*)    Casts HYALINE CASTS (*)    All other components within normal limits  CBG MONITORING, ED - Abnormal; Notable for the following:    Glucose-Capillary 132 (*)    All other components within normal limits   URINE CULTURE    Imaging Review Dg Chest 2 View  02/20/2014   CLINICAL DATA:  Fall, cough, shortness of breath, breast cancer.  EXAM: CHEST  2 VIEW  COMPARISON:  DG CHEST 2 VIEW dated 12/08/2013  FINDINGS: The cardiac silhouette remains mildly enlarged mediastinal silhouette is nonsuspicious mildly calcified aortic knob. Mild chronic interstitial changes, small left pleural effusion is similar. Bibasilar patchy airspace opacity  are unchanged. Trachea projects midline and there is no pneumothorax.  Multiple EKG lines overlie the patient and may obscure subtle underlying pathology. Patient is osteopenic.  IMPRESSION: Mild cardiomegaly, bibasilar airspace opacities with small left pleural effusion.   Electronically Signed   By: Elon Alas   On: 02/20/2014 01:49   Ct Head Wo Contrast  02/20/2014   CLINICAL DATA:  Dizziness. Unwitnessed fall. History of breast carcinoma.  EXAM: CT HEAD WITHOUT CONTRAST  TECHNIQUE: Contiguous axial images were obtained from the base of the skull through the vertex without intravenous contrast.  COMPARISON:  12/08/2013  FINDINGS: Ventricles are normal in configuration. There is age related ventricular and sulcal enlargement. Patchy white matter hypoattenuation is noted most consistent with chronic microvascular ischemic change. There are no parenchymal masses or mass effect. There is no evidence of a cortical infarct.  Small stable dermoid overlies the right lateral frontal lobe. No other extra-axial abnormalities.  No intracranial hemorrhage.  There is near complete opacification of the left maxillary sinus and the left-sided ethmoid air cells. Mucosal thickening and dependent fluid lies in the left frontal sinus inferiorly. Dependent fluid is seen in the left sphenoid sinus with mild mucosal thickening. Right-sided sinuses are clear. There are changes from right mastoidectomy. Left mastoid air cells are clear.  IMPRESSION: 1. No acute intracranial abnormalities. 2.  Significant sinus disease has developed since the prior study, involving the left-sided sinuses with fluid level is noted in the inferior left frontal sinus and left sphenoid sinus.   Electronically Signed   By: Lajean Manes M.D.   On: 02/20/2014 01:50     EKG Interpretation   Date/Time:  Tuesday February 20 2014 00:09:49 EDT Ventricular Rate:  54 PR Interval:  185 QRS Duration: 89 QT Interval:  466 QTC Calculation: 442 R Axis:   30 Text Interpretation:  Sinus rhythm No significant change was found  Confirmed by Liberta Gimpel  MD, Becci Batty ([id number]) on 02/20/2014 12:19:44 AM      MDM   Final diagnoses:  Urinary tract infection  Sinusitis  Fall    Patient presents with urinary tract infection is a likely cause of her nausea vomiting.  Urine culture sent.  Rocephin given.  No significant abdominal tenderness on exam.  No diarrhea.  Overall well-appearing.  From a fall sample in her head CT is negative for acute trauma but does demonstrate sinusitis.  She has no C-spine tenderness.  Discharge back to the nursing facility.  Family updated.    Hoy Morn, MD 02/20/14 [phone]

## 2014-02-20 NOTE — ED Notes (Signed)
[facility] called for patient transport 

## 2014-02-20 NOTE — Discharge Instructions (Signed)
Urinary Tract Infection Urinary tract infections (UTIs) can develop anywhere along your urinary tract. Your urinary tract is your body's drainage system for removing wastes and extra water. Your urinary tract includes two kidneys, two ureters, a bladder, and a urethra. Your kidneys are a pair of bean-shaped organs. Each kidney is about the size of your fist. They are located below your ribs, one on each side of your spine. CAUSES Infections are caused by microbes, which are microscopic organisms, including fungi, viruses, and bacteria. These organisms are so small that they can only be seen through a microscope. Bacteria are the microbes that most commonly cause UTIs. SYMPTOMS  Symptoms of UTIs may vary by age and gender of the patient and by the location of the infection. Symptoms in young women typically include a frequent and intense urge to urinate and a painful, burning feeling in the bladder or urethra during urination. Older women and men are more likely to be tired, shaky, and weak and have muscle aches and abdominal pain. A fever may mean the infection is in your kidneys. Other symptoms of a kidney infection include pain in your back or sides below the ribs, nausea, and vomiting. DIAGNOSIS To diagnose a UTI, your caregiver will ask you about your symptoms. Your caregiver also will ask to provide a urine sample. The urine sample will be tested for bacteria and white blood cells. White blood cells are made by your body to help fight infection. TREATMENT  Typically, UTIs can be treated with medication. Because most UTIs are caused by a bacterial infection, they usually can be treated with the use of antibiotics. The choice of antibiotic and length of treatment depend on your symptoms and the type of bacteria causing your infection. HOME CARE INSTRUCTIONS  If you were prescribed antibiotics, take them exactly as your caregiver instructs you. Finish the medication even if you feel better after you  have only taken some of the medication.  Drink enough water and fluids to keep your urine clear or pale yellow.  Avoid caffeine, tea, and carbonated beverages. They tend to irritate your bladder.  Empty your bladder often. Avoid holding urine for long periods of time.  Empty your bladder before and after sexual intercourse.  After a bowel movement, women should cleanse from front to back. Use each tissue only once. SEEK MEDICAL CARE IF:   You have back pain.  You develop a fever.  Your symptoms do not begin to resolve within 3 days. SEEK IMMEDIATE MEDICAL CARE IF:   You have severe back pain or lower abdominal pain.  You develop chills.  You have nausea or vomiting.  You have continued burning or discomfort with urination. MAKE SURE YOU:   Understand these instructions.  Will watch your condition.  Will get help right away if you are not doing well or get worse. Document Released: 06/17/2005 Document Revised: 03/08/2012 Document Reviewed: 10/16/2011 [facility] Patient Information 2014 [place].  Sinusitis Sinusitis is redness, soreness, and swelling (inflammation) of the paranasal sinuses. Paranasal sinuses are air pockets within the bones of your face (beneath the eyes, the middle of the forehead, or above the eyes). In healthy paranasal sinuses, mucus is able to drain out, and air is able to circulate through them by way of your nose. However, when your paranasal sinuses are inflamed, mucus and air can become trapped. This can allow bacteria and other germs to grow and cause infection. Sinusitis can develop quickly and last only a short time (acute) or  continue over a long period (chronic). Sinusitis that lasts for more than 12 weeks is considered chronic.  CAUSES  Causes of sinusitis include:  Allergies.  Structural abnormalities, such as displacement of the cartilage that separates your nostrils (deviated septum), which can decrease the air flow through your nose  and sinuses and affect sinus drainage.  Functional abnormalities, such as when the small hairs (cilia) that line your sinuses and help remove mucus do not work properly or are not present. SYMPTOMS  Symptoms of acute and chronic sinusitis are the same. The primary symptoms are pain and pressure around the affected sinuses. Other symptoms include:  Upper toothache.  Earache.  Headache.  Bad breath.  Decreased sense of smell and taste.  A cough, which worsens when you are lying flat.  Fatigue.  Fever.  Thick drainage from your nose, which often is green and may contain pus (purulent).  Swelling and warmth over the affected sinuses. DIAGNOSIS  Your caregiver will perform a physical exam. During the exam, your caregiver may:  Look in your nose for signs of abnormal growths in your nostrils (nasal polyps).  Tap over the affected sinus to check for signs of infection.  View the inside of your sinuses (endoscopy) with a special imaging device with a light attached (endoscope), which is inserted into your sinuses. If your caregiver suspects that you have chronic sinusitis, one or more of the following tests may be recommended:  Allergy tests.  Nasal culture A sample of mucus is taken from your nose and sent to a lab and screened for bacteria.  Nasal cytology A sample of mucus is taken from your nose and examined by your caregiver to determine if your sinusitis is related to an allergy. TREATMENT  Most cases of acute sinusitis are related to a viral infection and will resolve on their own within 10 days. Sometimes medicines are prescribed to help relieve symptoms (pain medicine, decongestants, nasal steroid sprays, or saline sprays).  However, for sinusitis related to a bacterial infection, your caregiver will prescribe antibiotic medicines. These are medicines that will help kill the bacteria causing the infection.  Rarely, sinusitis is caused by a fungal infection. In theses  cases, your caregiver will prescribe antifungal medicine. For some cases of chronic sinusitis, surgery is needed. Generally, these are cases in which sinusitis recurs more than 3 times per year, despite other treatments. HOME CARE INSTRUCTIONS   Drink plenty of water. Water helps thin the mucus so your sinuses can drain more easily.  Use a humidifier.  Inhale steam 3 to 4 times a day (for example, sit in the bathroom with the shower running).  Apply a warm, moist washcloth to your face 3 to 4 times a day, or as directed by your caregiver.  Use saline nasal sprays to help moisten and clean your sinuses.  Take over-the-counter or prescription medicines for pain, discomfort, or fever only as directed by your caregiver. SEEK IMMEDIATE MEDICAL CARE IF:  You have increasing pain or severe headaches.  You have nausea, vomiting, or drowsiness.  You have swelling around your face.  You have vision problems.  You have a stiff neck.  You have difficulty breathing. MAKE SURE YOU:   Understand these instructions.  Will watch your condition.  Will get help right away if you are not doing well or get worse. Document Released: 09/07/2005 Document Revised: 11/30/2011 Document Reviewed: 09/22/2011 [facility] Patient Information 2014 [place], [place].

## 2014-02-20 NOTE — ED Notes (Signed)
Bed: PQ98 Expected date:  Expected time:  Means of arrival:  Comments: EMS Fall

## 2014-02-20 NOTE — ED Notes (Signed)
Pt arrives by EMS from [facility] on [address]-un-witnessed fall-no obvious injuries, but pt c/o bilateral knee pain-no pain on palpation except she voiced pain on palpations to ankles-also c/o dizziness and small amount emesis. BP 160/80-CBG 133 HR 62

## 2014-02-21 ENCOUNTER — Ambulatory Visit (INDEPENDENT_AMBULATORY_CARE_PROVIDER_SITE_OTHER): Payer: Medicare PPO | Admitting: Gynecology

## 2014-02-21 ENCOUNTER — Encounter: Payer: Self-pay | Admitting: Gynecology

## 2014-02-21 DIAGNOSIS — N993 Prolapse of vaginal vault after hysterectomy: Secondary | ICD-10-CM

## 2014-02-21 NOTE — Patient Instructions (Signed)
Follow up in 4-6 months for pessary recheck 

## 2014-02-21 NOTE — Progress Notes (Signed)
Anna Morris 1917/01/12 [id number]        80 y.o.  [id number]  Presents for pessary removal cleaning and replacement.  Past medical history,surgical history, problem list, medications, allergies, family history and social history were all reviewed and documented in the EPIC chart.  Directed ROS with pertinent positives and negatives documented in the history of present illness/assessment and plan.  Exam: Kim assistant General appearance  Normal Pelvic external BUS vagina with atrophic changes. Pessary was removed. No evidence of erosion or irritation. Bimanual exam without masses or tenderness. Rectovaginal exam confirms. Pessary was cleaned and replaced without difficulty.  Assessment/Plan:  80 y.o. [id number] with vaginal vault prolapse following hysterectomy. Good response to a ring pessary. Followup in 4-6 months for pessary recheck.   Note: This document was prepared with digital dictation and possible smart phrase technology. Any transcriptional errors that result from this process are unintentional.   Anastasio Auerbach MD, 11:17 AM 02/21/2014

## 2014-02-22 LAB — URINE CULTURE: Colony Count: >=100000

## 2014-02-25 ENCOUNTER — Telehealth (HOSPITAL_BASED_OUTPATIENT_CLINIC_OR_DEPARTMENT_OTHER): Payer: Self-pay | Admitting: Emergency Medicine

## 2014-02-25 NOTE — Telephone Encounter (Signed)
Post ED Visit - Positive Culture Follow-up  Culture report reviewed by antimicrobial stewardship pharmacist: []  Wes Assumption, Pharm.D., BCPS []  Heide Guile, Pharm.D., BCPS []  Alycia Rossetti, Pharm.D., BCPS [x]  [place], Pharm.D., BCPS, AAHIVP []  Legrand Como, Pharm.D., BCPS, AAHIVP []  Juliene Pina, Pharm.D.  Positive urine culture Treated with Cefpodoxime, organism sensitive to the same and no further patient follow-up is required at this time.  Myrna Blazer 02/25/2014, 6:48 PM

## 2014-06-11 ENCOUNTER — Encounter (HOSPITAL_COMMUNITY): Payer: Self-pay | Admitting: Emergency Medicine

## 2014-06-11 ENCOUNTER — Emergency Department (HOSPITAL_COMMUNITY)
Admission: EM | Admit: 2014-06-11 | Discharge: 2014-06-11 | Disposition: A | Discharge status: Home / Self Care | Payer: Medicare PPO | Attending: Emergency Medicine | Admitting: Emergency Medicine

## 2014-06-11 ENCOUNTER — Emergency Department (HOSPITAL_COMMUNITY): Payer: Medicare PPO

## 2014-06-11 DIAGNOSIS — Z043 Encounter for examination and observation following other accident: Secondary | ICD-10-CM | POA: Insufficient documentation

## 2014-06-11 DIAGNOSIS — R296 Repeated falls: Secondary | ICD-10-CM | POA: Diagnosis not present

## 2014-06-11 DIAGNOSIS — F0390 Unspecified dementia without behavioral disturbance: Secondary | ICD-10-CM | POA: Insufficient documentation

## 2014-06-11 DIAGNOSIS — Z8701 Personal history of pneumonia (recurrent): Secondary | ICD-10-CM | POA: Insufficient documentation

## 2014-06-11 DIAGNOSIS — Z880 Allergy status to penicillin: Secondary | ICD-10-CM | POA: Diagnosis not present

## 2014-06-11 DIAGNOSIS — Z853 Personal history of malignant neoplasm of breast: Secondary | ICD-10-CM | POA: Insufficient documentation

## 2014-06-11 DIAGNOSIS — Z79899 Other long term (current) drug therapy: Secondary | ICD-10-CM | POA: Insufficient documentation

## 2014-06-11 DIAGNOSIS — Y9389 Activity, other specified: Secondary | ICD-10-CM | POA: Diagnosis not present

## 2014-06-11 DIAGNOSIS — Z9889 Other specified postprocedural states: Secondary | ICD-10-CM | POA: Insufficient documentation

## 2014-06-11 DIAGNOSIS — Z7982 Long term (current) use of aspirin: Secondary | ICD-10-CM | POA: Insufficient documentation

## 2014-06-11 DIAGNOSIS — Z8742 Personal history of other diseases of the female genital tract: Secondary | ICD-10-CM | POA: Insufficient documentation

## 2014-06-11 DIAGNOSIS — M899 Disorder of bone, unspecified: Secondary | ICD-10-CM | POA: Diagnosis not present

## 2014-06-11 DIAGNOSIS — Y9210 Unspecified residential institution as the place of occurrence of the external cause: Secondary | ICD-10-CM | POA: Insufficient documentation

## 2014-06-11 DIAGNOSIS — M949 Disorder of cartilage, unspecified: Secondary | ICD-10-CM

## 2014-06-11 DIAGNOSIS — W19XXXA Unspecified fall, initial encounter: Secondary | ICD-10-CM

## 2014-06-11 NOTE — ED Provider Notes (Signed)
CSN: [id number]     Arrival date & time 06/11/14  1643 History   First MD Initiated Contact with Patient 06/11/14 2132     Chief Complaint  Patient presents with  . Fall     (Consider location/radiation/quality/duration/timing/severity/associated sxs/prior Treatment) HPI Comments: Patient is a 81 yo F PMHx significant for dementia presenting to the emergency department from assisted living facility after a witnessed mechanical fall this afternoon. The patient and assisted living staff patient was talking to other residents when she turned to the side, lost her footing and fell to the ground. No LOC. Denies any precipitating CP, SOB, HA, lightheadedness, dizziness. Patient complained of a posterior headache and neck pain after the incident, but is currently pain free now. Family endorses patient is at baseline.  Patient is a 80 y.o. female presenting with fall.  Fall    Past Medical History  Diagnosis Date  . Cystocele   . Osteopenia   . Vaginal vault prolapse   . Cancer     Breast cancer  . DUB (dysfunctional uterine bleeding)   . Pneumonia 2006  . Urinary retention   . Dementia    Past Surgical History  Procedure Laterality Date  . Breast surgery      Mastectomy  . Total knee arthroplasty    . Vaginal hysterectomy    . Hemorrhoid surgery    . Back surgery    . Cholecystectomy    . Cardiac catheterization     Family History  Problem Relation Age of Onset  . Heart disease Father   . Breast cancer Sister    History  Substance Use Topics  . Smoking status: Never Smoker   . Smokeless tobacco: Never Used  . Alcohol Use: Not on file   OB History   Grav Para Term Preterm Abortions TAB SAB Ect Mult Living   3 3 3       2      Review of Systems  All other systems reviewed and are negative.     Allergies  Amoxicillin-pot clavulanate; Ciprofloxacin; Codeine; Erythromycin; Levofloxacin; Metronidazole; and Sulfa antibiotics  Home Medications   Prior to Admission  medications   Medication Sig Start Date End Date Taking? Authorizing Provider  acetaminophen (TYLENOL) 500 MG tablet Take 500 mg by mouth every 6 (six) hours as needed for mild pain or headache.    Yes Historical Provider, MD  aspirin 81 MG tablet Take 81 mg by mouth daily.   Yes Historical Provider, MD  atenolol (TENORMIN) 25 MG tablet Take 25 mg by mouth 2 (two) times daily.    Yes Historical Provider, MD  beta carotene w/minerals (OCUVITE) tablet Take 2 tablets by mouth 2 (two) times daily.   Yes Historical Provider, MD  calcium carbonate (OS-CAL) 600 MG TABS tablet Take 1,200 mg by mouth daily with breakfast.   Yes Historical Provider, MD  cholecalciferol (VITAMIN D) 1000 UNITS tablet Take 2,000 Units by mouth daily.   Yes Historical Provider, MD  fexofenadine (ALLEGRA) 180 MG tablet Take 180 mg by mouth daily.   Yes Historical Provider, MD  furosemide (LASIX) 20 MG tablet Take 20 mg by mouth daily.   Yes Historical Provider, MD  Multiple Vitamin (MULTIVITAMIN) tablet Take 1 tablet by mouth daily.   Yes Historical Provider, MD  pantoprazole (PROTONIX) 40 MG tablet Take 40 mg by mouth daily.   Yes Historical Provider, MD  potassium chloride (K-DUR) 10 MEQ tablet Take 10 mEq by mouth daily.   Yes Historical Provider,  MD  Probiotic Product (ALIGN) 4 MG CAPS Take 1 capsule by mouth every morning.   Yes Historical Provider, MD  risperiDONE (RISPERDAL) 0.5 MG tablet Take 0.5 mg by mouth 2 (two) times daily.   Yes Historical Provider, MD  sertraline (ZOLOFT) 25 MG tablet Take 25 mg by mouth daily.   Yes Historical Provider, MD  sodium chloride (OCEAN) 0.65 % SOLN nasal spray Place 1 spray into both nostrils 4 (four) times daily as needed (dry nostrils).   Yes Historical Provider, MD  Tamsulosin HCl (FLOMAX) 0.4 MG CAPS Take 0.4 mg by mouth daily after breakfast.    Yes Historical Provider, MD  trimethoprim (TRIMPEX) 100 MG tablet Take 100 mg by mouth at bedtime.    Yes Historical Provider, MD   BP  148/86  Pulse 61  Temp(Src) 98 F (36.7 C) (Oral)  Resp 18  SpO2 95% Physical Exam  Nursing note and vitals reviewed. Constitutional: She is oriented to person, place, and time. She appears well-developed and well-nourished. No distress.  HENT:  Head: Normocephalic and atraumatic.  Right Ear: External ear normal.  Left Ear: External ear normal.  Nose: Nose normal.  Mouth/Throat: Oropharynx is clear and moist. No oropharyngeal exudate.  Eyes: Conjunctivae and EOM are normal. Pupils are equal, round, and reactive to light.  Neck: Normal range of motion and full passive range of motion without pain. Neck supple. No spinous process tenderness and no muscular tenderness present.  Cardiovascular: Normal rate, regular rhythm, normal heart sounds and intact distal pulses.   Pulmonary/Chest: Effort normal and breath sounds normal. No respiratory distress.  Abdominal: Soft. There is no tenderness.  Musculoskeletal:       Cervical back: Normal.       Thoracic back: Normal.       Lumbar back: Normal.  MAE without ataxia.   Neurological: She is alert and oriented to person, place, and time. She has normal strength. No cranial nerve deficit. Gait normal. GCS eye subscore is 4. GCS verbal subscore is 5. GCS motor subscore is 6.  Sensation grossly intact.  No pronator drift.    Skin: Skin is warm and dry. She is not diaphoretic.    ED Course  Procedures (including critical care time) Medications - No data to display  Labs Review Labs Reviewed - No data to display  Imaging Review Ct Head Wo Contrast  06/11/2014   CLINICAL DATA:  Fall, headache, neck pain  EXAM: CT HEAD WITHOUT CONTRAST  CT CERVICAL SPINE WITHOUT CONTRAST  TECHNIQUE: Multidetector CT imaging of the head and cervical spine was performed following the standard protocol without intravenous contrast. Multiplanar CT image reconstructions of the cervical spine were also generated.  COMPARISON:  CT head dated 02/20/2014  FINDINGS: CT  HEAD FINDINGS  No evidence of parenchymal hemorrhage or extra-axial fluid collection.  Stable dermoid overlying the right frontal lobe (series 2/image 19). No mass effect or midline shift.  No CT evidence of acute infarction.  Subcortical white matter and periventricular small vessel ischemic changes.  Cerebral volume is within normal limits.  No ventriculomegaly.  The visualized paranasal sinuses are essentially clear. Prior right mastoidectomy.  No evidence of calvarial fracture.  CT CERVICAL SPINE FINDINGS  Exaggerated upper cervical lordosis.  No evidence of fracture or dislocation. Vertebral body heights are maintained. Dens appears intact.  No prevertebral soft tissue swelling.  Mild to moderate multilevel degenerative changes.  Visualized thyroid is unremarkable.  Visualized lung apices are notable for biapical pleural parenchymal scarring.  IMPRESSION: No evidence of acute intracranial abnormality. Small vessel ischemic changes.  No evidence of traumatic injury to the cervical spine. Mild to moderate degenerative changes.   Electronically Signed   By: Julian Hy M.D.   On: 06/11/2014 22:23   Ct Cervical Spine Wo Contrast  06/11/2014   CLINICAL DATA:  Fall, headache, neck pain  EXAM: CT HEAD WITHOUT CONTRAST  CT CERVICAL SPINE WITHOUT CONTRAST  TECHNIQUE: Multidetector CT imaging of the head and cervical spine was performed following the standard protocol without intravenous contrast. Multiplanar CT image reconstructions of the cervical spine were also generated.  COMPARISON:  CT head dated 02/20/2014  FINDINGS: CT HEAD FINDINGS  No evidence of parenchymal hemorrhage or extra-axial fluid collection.  Stable dermoid overlying the right frontal lobe (series 2/image 19). No mass effect or midline shift.  No CT evidence of acute infarction.  Subcortical white matter and periventricular small vessel ischemic changes.  Cerebral volume is within normal limits.  No ventriculomegaly.  The visualized  paranasal sinuses are essentially clear. Prior right mastoidectomy.  No evidence of calvarial fracture.  CT CERVICAL SPINE FINDINGS  Exaggerated upper cervical lordosis.  No evidence of fracture or dislocation. Vertebral body heights are maintained. Dens appears intact.  No prevertebral soft tissue swelling.  Mild to moderate multilevel degenerative changes.  Visualized thyroid is unremarkable.  Visualized lung apices are notable for biapical pleural parenchymal scarring.  IMPRESSION: No evidence of acute intracranial abnormality. Small vessel ischemic changes.  No evidence of traumatic injury to the cervical spine. Mild to moderate degenerative changes.   Electronically Signed   By: Julian Hy M.D.   On: 06/11/2014 22:23     EKG Interpretation None      MDM   Final diagnoses:  Fall, initial encounter   Filed Vitals:   06/11/14 2231  BP: 148/86  Pulse: 61  Temp: 98 F (36.7 C)  Resp: 18   Afebrile, NAD, non-toxic appearing, AAOx4.   Patient presenting to the ED after mechanical fall. No neurofocal deficits on examination. Physical examination unremarkable. CT head and neck unremarkable. Patient symptom free at time of discharge. Return precautions discussed with family and patient. Patient is agreeable to plan. Patient / Family / Caregiver informed of clinical course, understand medical decision-making and is agreeable to plan. Patient d/w with Dr. Wilson Singer, agrees with plan.       [place], PA-C 06/12/14 0013

## 2014-06-11 NOTE — Discharge Instructions (Signed)
Your head and neck CT scans were negative for any acute fractures, bleeding, or other abnormality. Please follow up with your primary care physician in 1-2 days. If you do not have one please call the [facility] number listed above. Please read all discharge instructions and return precautions.   Head Injury You have received a head injury. It does not appear serious at this time. Headaches and vomiting are common following head injury. It should be easy to awaken from sleeping. Sometimes it is necessary for you to stay in the emergency department for a while for observation. Sometimes admission to the hospital may be needed. After injuries such as yours, most problems occur within the first 24 hours, but side effects may occur up to 7-10 days after the injury. It is important for you to carefully monitor your condition and contact your health care provider or seek immediate medical care if there is a change in your condition. WHAT ARE THE TYPES OF HEAD INJURIES? Head injuries can be as minor as a bump. Some head injuries can be more severe. More severe head injuries include:  A jarring injury to the brain (concussion).  A bruise of the brain (contusion). This mean there is bleeding in the brain that can cause swelling.  A cracked skull (skull fracture).  Bleeding in the brain that collects, clots, and forms a bump (hematoma). WHAT CAUSES A HEAD INJURY? A serious head injury is most likely to happen to someone who is in a car wreck and is not wearing a seat belt. Other causes of major head injuries include bicycle or motorcycle accidents, sports injuries, and falls. HOW ARE HEAD INJURIES DIAGNOSED? A complete history of the event leading to the injury and your current symptoms will be helpful in diagnosing head injuries. Many times, pictures of the brain, such as CT or MRI are needed to see the extent of the injury. Often, an overnight hospital stay is necessary for observation.   WHEN SHOULD I SEEK IMMEDIATE MEDICAL CARE?  You should get help right away if:  You have confusion or drowsiness.  You feel sick to your stomach (nauseous) or have continued, forceful vomiting.  You have dizziness or unsteadiness that is getting worse.  You have severe, continued headaches not relieved by medicine. Only take over-the-counter or prescription medicines for pain, fever, or discomfort as directed by your health care provider.  You do not have normal function of the arms or legs or are unable to walk.  You notice changes in the black spots in the center of the colored part of your eye (pupil).  You have a clear or bloody fluid coming from your nose or ears.  You have a loss of vision. During the next 24 hours after the injury, you must stay with someone who can watch you for the warning signs. This person should contact local emergency services (911 in the [place] if you have seizures, you become unconscious, or you are unable to wake up. HOW CAN I PREVENT A HEAD INJURY IN THE FUTURE? The most important factor for preventing major head injuries is avoiding motor vehicle accidents. To minimize the potential for damage to your head, it is crucial to wear seat belts while riding in motor vehicles. Wearing helmets while bike riding and playing collision sports (like football) is also helpful. Also, avoiding dangerous activities around the house will further help reduce your risk of head injury.  WHEN CAN I RETURN TO NORMAL ACTIVITIES AND ATHLETICS?  You should be reevaluated by your health care provider before returning to these activities. If you have any of the following symptoms, you should not return to activities or contact sports until 1 week after the symptoms have stopped:  Persistent headache.  Dizziness or vertigo.  Poor attention and concentration.  Confusion.  Memory problems.  Nausea or vomiting.  Fatigue or tire easily.  Irritability.  Intolerant of  bright lights or loud noises.  Anxiety or depression.  Disturbed sleep. MAKE SURE YOU:   Understand these instructions.  Will watch your condition.  Will get help right away if you are not doing well or get worse. Document Released: 09/07/2005 Document Revised: 09/12/2013 Document Reviewed: 05/15/2013 [facility] Patient Information 2015 [place], [place]. This information is not intended to replace advice given to you by your health care provider. Make sure you discuss any questions you have with your health care provider.

## 2014-06-11 NOTE — ED Notes (Signed)
Pt had witnessed fall by nurse at [facility] assisted [facility], pt hit head on floor around 1545. Pt denies headache at the moment but c/o headache per famuly. Pt state back is hurting. Pt has dementia

## 2014-06-12 NOTE — ED Provider Notes (Signed)
Medical screening examination/treatment/procedure(s) were performed by non-physician practitioner and as supervising physician I was immediately available for consultation/collaboration.   EKG Interpretation None       Virgel Manifold, MD 06/12/14 0040

## 2014-06-27 ENCOUNTER — Encounter: Payer: Self-pay | Admitting: Gynecology

## 2014-06-27 ENCOUNTER — Ambulatory Visit (INDEPENDENT_AMBULATORY_CARE_PROVIDER_SITE_OTHER): Payer: Medicare PPO | Admitting: Gynecology

## 2014-06-27 DIAGNOSIS — N993 Prolapse of vaginal vault after hysterectomy: Secondary | ICD-10-CM

## 2014-06-27 NOTE — Progress Notes (Signed)
FLORIDE HUTMACHER 12-Dec-1916 [id number]        80 y.o.  [id number] Presents for pessary removal, cleaning and replacement.  Past medical history,surgical history, problem list, medications, allergies, family history and social history were all reviewed and documented in the EPIC chart.  Directed ROS with pertinent positives and negatives documented in the history of present illness/assessment and plan.  Exam: Kim assistant General appearance:  Normal Pelvic external BUS vagina with atrophic changes. Her ring history was removed. Vaginal mucosa without evidence of erosion or irritation. Bimanual exam is without masses or tenderness. Rectovaginal exam is normal. Pessary was cleaned and replaced without difficulty.  Assessment/Plan:  80 y.o. [id number] vaginal vault prolapse following hysterectomy. Good response to her ring pessary. Follow up in 3-6 months for reexamination.     Anastasio Auerbach MD, 10:57 AM 06/27/2014

## 2014-06-27 NOTE — Patient Instructions (Signed)
Follow up in 3-6 months for reexamination

## 2014-07-12 ENCOUNTER — Ambulatory Visit (INDEPENDENT_AMBULATORY_CARE_PROVIDER_SITE_OTHER): Payer: Medicare PPO | Admitting: Ophthalmology

## 2014-07-12 DIAGNOSIS — H43813 Vitreous degeneration, bilateral: Secondary | ICD-10-CM

## 2014-07-12 DIAGNOSIS — H3531 Nonexudative age-related macular degeneration: Secondary | ICD-10-CM

## 2014-07-12 DIAGNOSIS — H35033 Hypertensive retinopathy, bilateral: Secondary | ICD-10-CM

## 2014-07-15 ENCOUNTER — Encounter: Payer: Self-pay | Admitting: *Deleted

## 2014-07-23 ENCOUNTER — Encounter: Payer: Self-pay | Admitting: *Deleted

## 2014-10-25 ENCOUNTER — Encounter: Payer: Self-pay | Admitting: Gynecology

## 2014-10-25 ENCOUNTER — Ambulatory Visit (INDEPENDENT_AMBULATORY_CARE_PROVIDER_SITE_OTHER): Payer: Medicare PPO | Admitting: Gynecology

## 2014-10-25 VITALS — BP 120/78

## 2014-10-25 DIAGNOSIS — N993 Prolapse of vaginal vault after hysterectomy: Secondary | ICD-10-CM

## 2014-10-25 DIAGNOSIS — N952 Postmenopausal atrophic vaginitis: Secondary | ICD-10-CM

## 2014-10-25 NOTE — Progress Notes (Signed)
Anna Morris Mar 12, 1917 [id number]        81 y.o.  [id number] Presents for pessary recheck with history of vaginal vault prolapse following hysterectomy. Uses ring pessary for years with good results. Without complaints. Accompanied by her daughter-in-law  Past medical history,surgical history, problem list, medications, allergies, family history and social history were all reviewed and documented in the EPIC chart.  Directed ROS with pertinent positives and negatives documented in the history of present illness/assessment and plan.  Exam: Kim assistant General appearance:  Normal/elderly Abdomen soft nontender without masses guarding rebound Pelvic external BUS vagina with atrophic changes. Ring pessary with support removed. Vaginal mucosa without evidence of irritation or erosion. Bimanual exam without masses or tenderness.  Ring pessary was removed, cleansed and replaced without difficulty.  Assessment/Plan:  81 y.o. [id number] with vaginal vault prolapse following hysterectomy. Good response to pessary. No evidence of irritation/erosion. Follow up in 4-6 months for pessary reexamination.     Anastasio Auerbach MD, 11:05 AM 10/25/2014

## 2014-10-25 NOTE — Patient Instructions (Signed)
Follow up in 4-6 months for pessary reexamination

## 2015-03-06 ENCOUNTER — Ambulatory Visit: Payer: Medicare PPO | Admitting: Gynecology

## 2015-03-11 ENCOUNTER — Encounter: Payer: Self-pay | Admitting: Gynecology

## 2015-03-11 ENCOUNTER — Ambulatory Visit (INDEPENDENT_AMBULATORY_CARE_PROVIDER_SITE_OTHER): Payer: Medicare PPO | Admitting: Gynecology

## 2015-03-11 VITALS — BP 124/78

## 2015-03-11 DIAGNOSIS — N993 Prolapse of vaginal vault after hysterectomy: Secondary | ICD-10-CM | POA: Diagnosis not present

## 2015-03-11 NOTE — Progress Notes (Signed)
Anna Morris 1917-07-18 [id number]        81 y.o.  [id number] presents for pessary recheck. History of vaginal vault prolapse following hysterectomy with good results from a ring pessary. Is accompanied by her daughter-in-law. No voiced complaints.  Past medical history,surgical history, problem list, medications, allergies, family history and social history were all reviewed and documented in the EPIC chart.  Directed ROS with pertinent positives and negatives documented in the history of present illness/assessment and plan.  Exam: Kim assistant Filed Vitals:   03/11/15 1540  BP: 124/78   General appearance:  Normal Abdomen obese without masses guarding rebound Pelvic external BUS vagina with atrophic changes. Ring pessary removed. Vaginal mucosa without erosion or significant irritation. Second-degree cystocele noted. Bimanual without masses or tenderness.  When pessary was cleansed and replaced without difficulty.  Assessment/Plan:  81 y.o. [id number] with vaginal vault prolapse following hysterectomy with good response to ring pessary. Follow up in 4-6 months for pessary reexamination.    Anastasio Auerbach MD, 3:52 PM 03/11/2015

## 2015-03-11 NOTE — Patient Instructions (Signed)
Follow up in 4-6 months for pessary reexamination

## 2015-07-18 ENCOUNTER — Ambulatory Visit: Payer: Medicare PPO | Admitting: Gynecology

## 2015-07-23 ENCOUNTER — Ambulatory Visit (INDEPENDENT_AMBULATORY_CARE_PROVIDER_SITE_OTHER): Payer: Medicare PPO | Admitting: Ophthalmology

## 2015-07-24 ENCOUNTER — Ambulatory Visit (INDEPENDENT_AMBULATORY_CARE_PROVIDER_SITE_OTHER): Payer: Medicare PPO | Admitting: Gynecology

## 2015-07-24 ENCOUNTER — Encounter: Payer: Self-pay | Admitting: Gynecology

## 2015-07-24 VITALS — BP 126/80

## 2015-07-24 DIAGNOSIS — N993 Prolapse of vaginal vault after hysterectomy: Secondary | ICD-10-CM | POA: Diagnosis not present

## 2015-07-24 NOTE — Progress Notes (Signed)
Anna Morris [birth year] [id number]        81 y.o.  [id number] Presents with her daughter-in-law or pessary recheck. History of vaginal vault prolapse after hysterectomy with good support from ring pessary.  Past medical history,surgical history, problem list, medications, allergies, family history and social history were all reviewed and documented in the EPIC chart.  Directed ROS with pertinent positives and negatives documented in the history of present illness/assessment and plan.  Exam: Kim assistant Filed Vitals:   07/24/15 1023  BP: 126/80   General appearance:  Normal External BUS vagina with atrophic changes. No significant irritation or erosion from the pessary. Bimanual without masses or tenderness.  Pessary was removed cleansed and replaced without difficulty  Assessment/Plan:  81 y.o. [id number] with vaginal vault prolapse following hysterectomy with good response to ring pessary. Follow up in 4-6 months for pessary recheck. Sooner if any issues.    Anastasio Auerbach MD, 10:38 AM 07/24/2015

## 2015-07-24 NOTE — Patient Instructions (Signed)
Follow up in 4-6 months for pessary reexamination

## 2015-09-20 IMAGING — CR DG CHEST 2V
2 series · 2 of 2 positions shown · non-contrast
Comparison: DG CHEST 2 VIEW dated 12/08/2013

CLINICAL DATA: Fall, cough, shortness of breath, breast cancer.

EXAM:
CHEST  2 VIEW

[w chest lat]
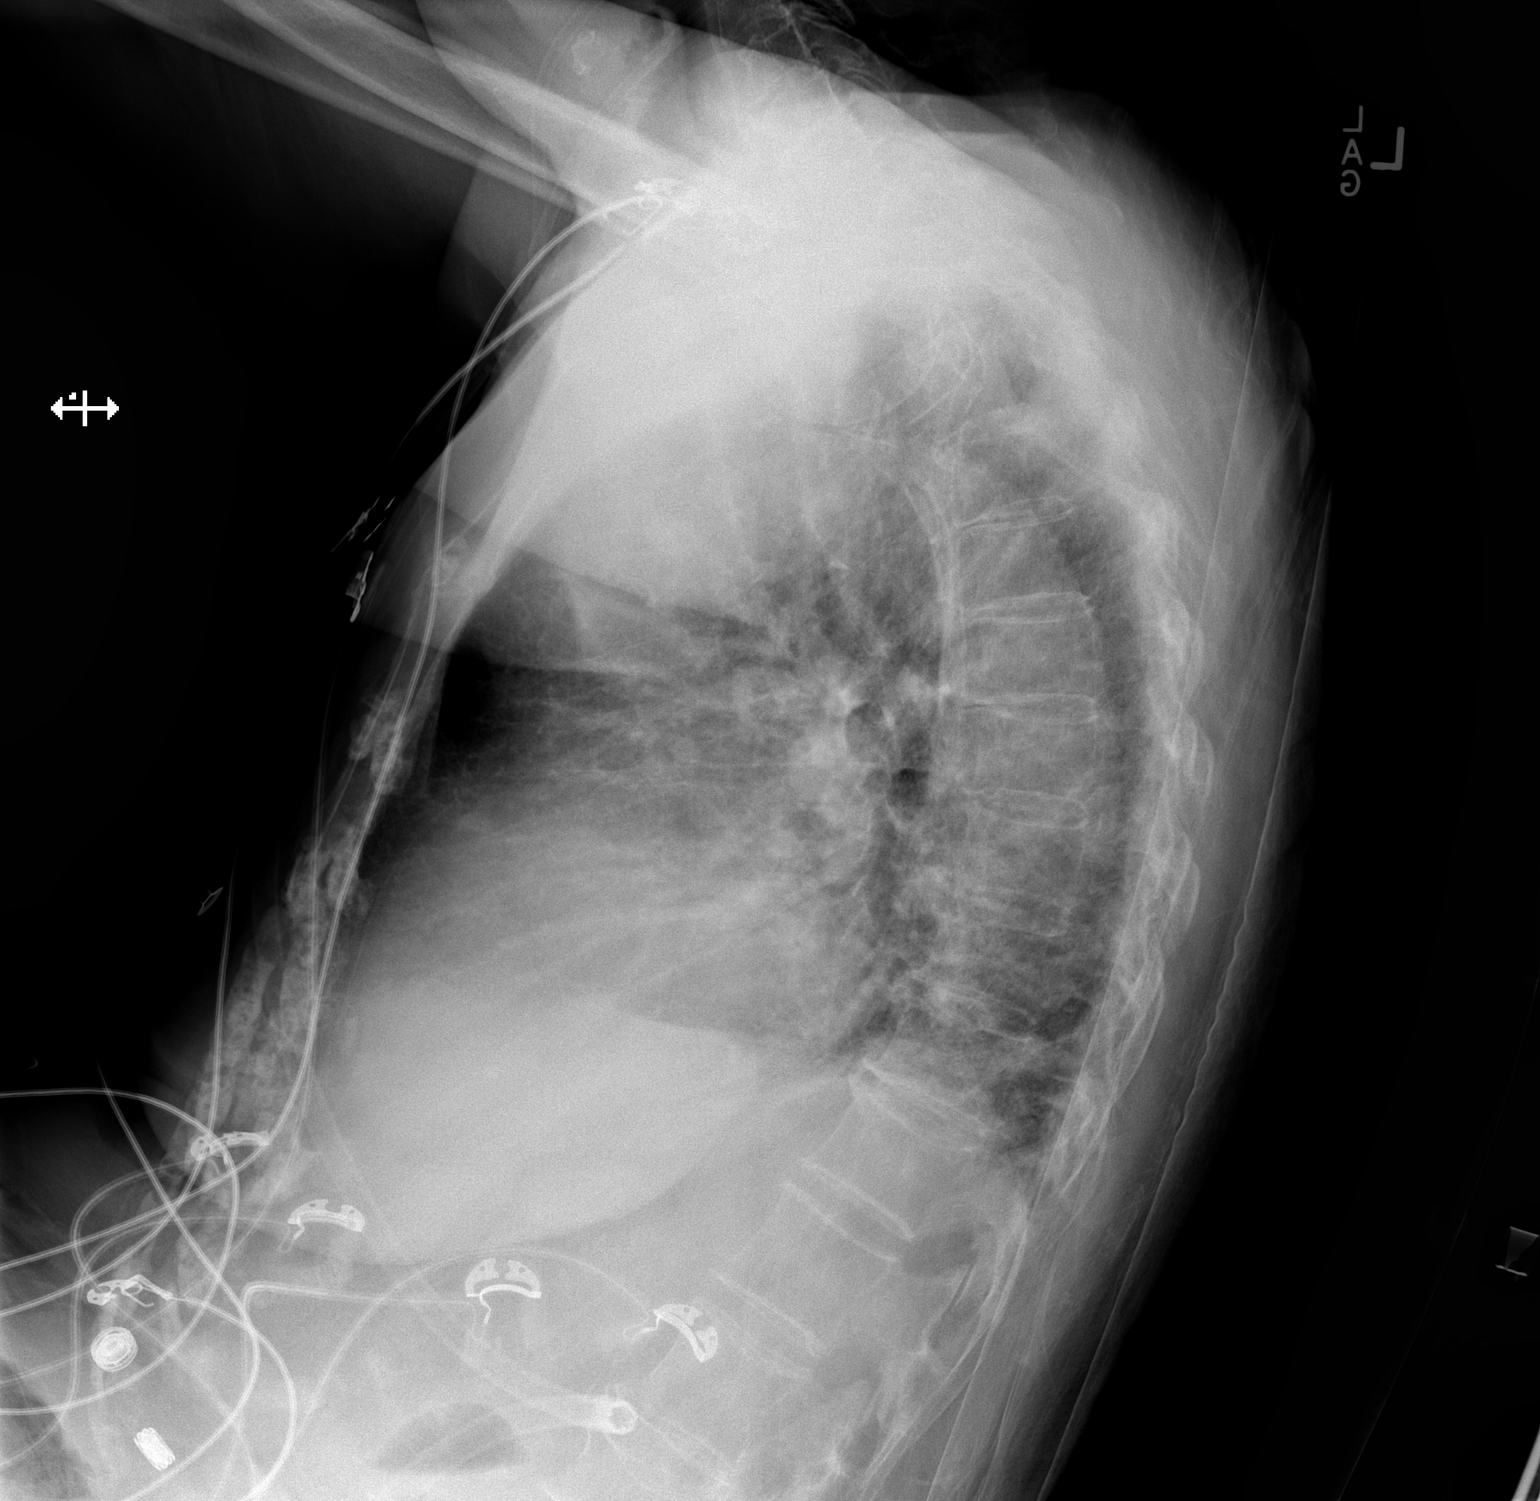

[x chest ap]
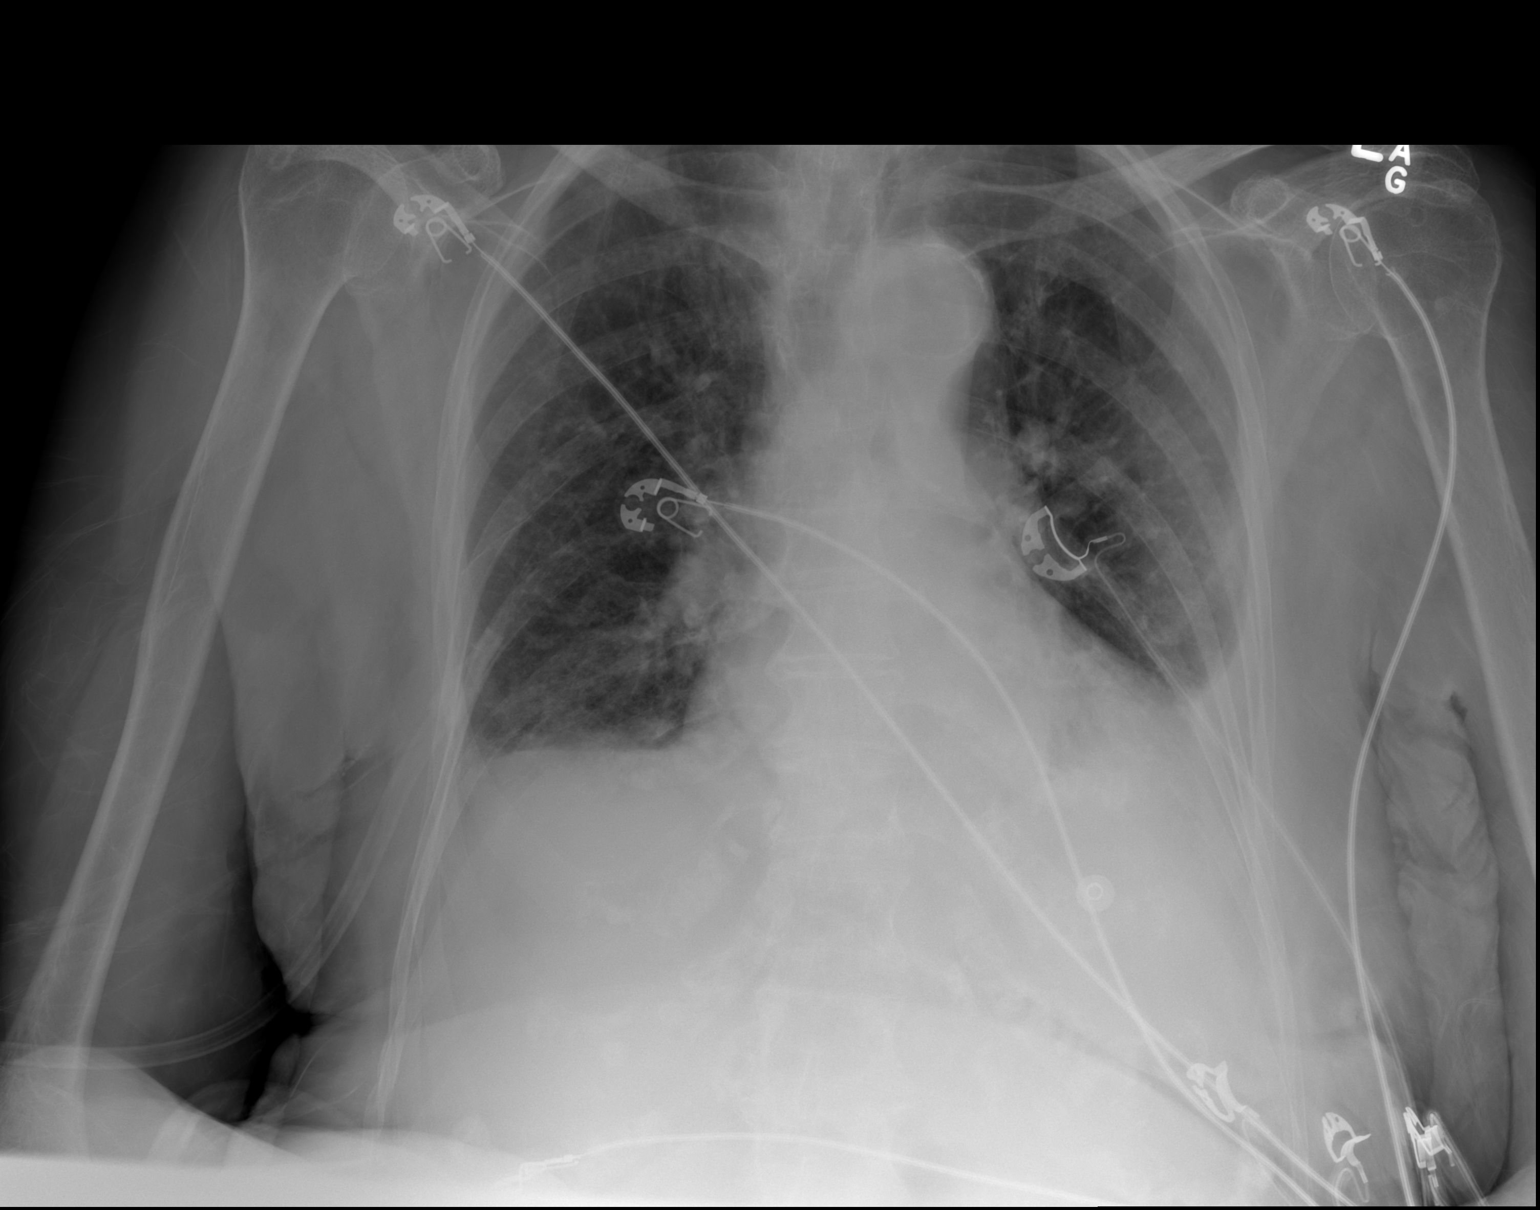

[2 of 2 positions shown; findings below may reference images not displayed]

FINDINGS: The cardiac silhouette remains mildly enlarged mediastinal
silhouette is nonsuspicious mildly calcified aortic knob. Mild
chronic interstitial changes, small left pleural effusion is
similar. Bibasilar patchy airspace opacity are unchanged. Trachea
projects midline and there is no pneumothorax.

Multiple EKG lines overlie the patient and may obscure subtle
underlying pathology. Patient is osteopenic.
IMPRESSION: Mild cardiomegaly, bibasilar airspace opacities with small left
pleural effusion.

  By: Lysette Blaylock

## 2016-01-02 ENCOUNTER — Ambulatory Visit: Payer: Medicare PPO | Admitting: Gynecology

## 2016-01-09 ENCOUNTER — Ambulatory Visit: Payer: Medicare PPO | Admitting: Gynecology

## 2016-04-21 DEATH — deceased

## 2019-06-29 ENCOUNTER — Encounter: Payer: Self-pay | Admitting: Gynecology
